# Patient Record
Sex: Male | Born: 1960 | Hispanic: Yes | Marital: Married | Smoking: Former smoker
Health system: Southern US, Community
[De-identification: ages and names within clinical notes are randomized; demographics above are authoritative.]

## PROBLEM LIST (undated history)

## (undated) DIAGNOSIS — E119 Type 2 diabetes mellitus without complications: Secondary | ICD-10-CM

## (undated) DIAGNOSIS — F32A Depression, unspecified: Secondary | ICD-10-CM

## (undated) DIAGNOSIS — F329 Major depressive disorder, single episode, unspecified: Secondary | ICD-10-CM

## (undated) HISTORY — DX: Depression, unspecified: F32.A

## (undated) HISTORY — PX: HERNIA REPAIR: SHX51

## (undated) HISTORY — DX: Type 2 diabetes mellitus without complications: E11.9

## (undated) HISTORY — DX: Major depressive disorder, single episode, unspecified: F32.9

---

## 2015-06-29 ENCOUNTER — Encounter (HOSPITAL_COMMUNITY): Payer: Self-pay | Admitting: Cardiology

## 2015-06-29 ENCOUNTER — Emergency Department (HOSPITAL_COMMUNITY)
Admission: EM | Admit: 2015-06-29 | Discharge: 2015-06-29 | Disposition: A | Discharge status: Home / Self Care | Payer: No Typology Code available for payment source | Attending: Emergency Medicine | Admitting: Emergency Medicine

## 2015-06-29 ENCOUNTER — Ambulatory Visit (INDEPENDENT_AMBULATORY_CARE_PROVIDER_SITE_OTHER): Payer: No Typology Code available for payment source | Admitting: Family Medicine

## 2015-06-29 VITALS — BP 106/72 | HR 72 | Temp 98.6°F | Resp 16 | Ht 67.5 in | Wt 142.0 lb

## 2015-06-29 DIAGNOSIS — R350 Frequency of micturition: Secondary | ICD-10-CM | POA: Diagnosis not present

## 2015-06-29 DIAGNOSIS — R634 Abnormal weight loss: Secondary | ICD-10-CM | POA: Diagnosis not present

## 2015-06-29 DIAGNOSIS — R002 Palpitations: Secondary | ICD-10-CM

## 2015-06-29 DIAGNOSIS — R739 Hyperglycemia, unspecified: Secondary | ICD-10-CM

## 2015-06-29 DIAGNOSIS — R358 Other polyuria: Secondary | ICD-10-CM | POA: Diagnosis present

## 2015-06-29 DIAGNOSIS — R252 Cramp and spasm: Secondary | ICD-10-CM

## 2015-06-29 DIAGNOSIS — E1165 Type 2 diabetes mellitus with hyperglycemia: Secondary | ICD-10-CM | POA: Diagnosis not present

## 2015-06-29 DIAGNOSIS — E119 Type 2 diabetes mellitus without complications: Secondary | ICD-10-CM

## 2015-06-29 LAB — POCT GLYCOSYLATED HEMOGLOBIN (HGB A1C): Hemoglobin A1C: >14

## 2015-06-29 LAB — POCT UA - MICROSCOPIC ONLY
BACTERIA, U MICROSCOPIC: NEGATIVE
CASTS, UR, LPF, POC: NEGATIVE
CRYSTALS, UR, HPF, POC: NEGATIVE
Epithelial cells, urine per micros: NEGATIVE
Mucus, UA: NEGATIVE
RBC, urine, microscopic: NEGATIVE
Yeast, UA: NEGATIVE

## 2015-06-29 LAB — BASIC METABOLIC PANEL
ANION GAP: 10 (ref 5–15)
BUN: 27 mg/dL — AB (ref 6–20)
CO2: 27 mmol/L (ref 22–32)
Calcium: 9.9 mg/dL (ref 8.9–10.3)
Chloride: 93 mmol/L — ABNORMAL LOW (ref 101–111)
Creatinine, Ser: 0.91 mg/dL (ref 0.61–1.24)
GFR calc Af Amer: >60 mL/min (ref >60)
GLUCOSE: 467 mg/dL — AB (ref 65–99)
Potassium: 4.5 mmol/L (ref 3.5–5.1)
SODIUM: 130 mmol/L — AB (ref 135–145)

## 2015-06-29 LAB — POCT CBC
Granulocyte percent: 69.4 %G (ref 37–80)
HCT, POC: 45.9 % (ref 43.5–53.7)
HEMOGLOBIN: 15.2 g/dL (ref 14.1–18.1)
Lymph, poc: 1.1 (ref 0.6–3.4)
MCH: 31.4 pg — AB (ref 27–31.2)
MCHC: 33.1 g/dL (ref 31.8–35.4)
MCV: 94.9 fL (ref 80–97)
MID (cbc): 0.3 (ref 0–0.9)
MPV: 8.1 fL (ref 0–99.8)
PLATELET COUNT, POC: 299 10*3/uL (ref 142–424)
POC Granulocyte: 3.1 (ref 2–6.9)
POC LYMPH %: 25 % (ref 10–50)
POC MID %: 5.6 % (ref 0–12)
RBC: 4.83 M/uL (ref 4.69–6.13)
RDW, POC: 12.7 %
WBC: 4.5 10*3/uL — AB (ref 4.6–10.2)

## 2015-06-29 LAB — URINALYSIS, ROUTINE W REFLEX MICROSCOPIC
Bilirubin Urine: NEGATIVE
Hgb urine dipstick: NEGATIVE
KETONES UR: 15 mg/dL — AB
Leukocytes, UA: NEGATIVE
Nitrite: NEGATIVE
PH: 6 (ref 5.0–8.0)
PROTEIN: NEGATIVE mg/dL
Specific Gravity, Urine: 1.038 — ABNORMAL HIGH (ref 1.005–1.030)
Urobilinogen, UA: 0.2 mg/dL (ref 0.0–1.0)

## 2015-06-29 LAB — POCT URINALYSIS DIPSTICK
BILIRUBIN UA: NEGATIVE
Glucose, UA: >1000
Ketones, UA: NEGATIVE
LEUKOCYTES UA: NEGATIVE
NITRITE UA: NEGATIVE
PROTEIN UA: NEGATIVE
RBC UA: NEGATIVE
Spec Grav, UA: <=1.005
UROBILINOGEN UA: 0.2
pH, UA: 6

## 2015-06-29 LAB — COMPREHENSIVE METABOLIC PANEL
ALBUMIN: 4.2 g/dL (ref 3.6–5.1)
ALT: 17 U/L (ref 9–46)
AST: 15 U/L (ref 10–35)
Alkaline Phosphatase: 122 U/L — ABNORMAL HIGH (ref 40–115)
BUN: 29 mg/dL — ABNORMAL HIGH (ref 7–25)
CO2: 22 mmol/L (ref 20–31)
CREATININE: 0.89 mg/dL (ref 0.70–1.33)
Calcium: 9.6 mg/dL (ref 8.6–10.3)
Chloride: 93 mmol/L — ABNORMAL LOW (ref 98–110)
Glucose, Bld: 541 mg/dL (ref 65–99)
POTASSIUM: 5.2 mmol/L (ref 3.5–5.3)
SODIUM: 127 mmol/L — AB (ref 135–146)
Total Bilirubin: 0.7 mg/dL (ref 0.2–1.2)
Total Protein: 8 g/dL (ref 6.1–8.1)

## 2015-06-29 LAB — CBC
HEMATOCRIT: 42.8 % (ref 39.0–52.0)
Hemoglobin: 15.2 g/dL (ref 13.0–17.0)
MCH: 33.2 pg (ref 26.0–34.0)
MCHC: 35.5 g/dL (ref 30.0–36.0)
MCV: 93.4 fL (ref 78.0–100.0)
Platelets: 299 10*3/uL (ref 150–400)
RBC: 4.58 MIL/uL (ref 4.22–5.81)
RDW: 12.3 % (ref 11.5–15.5)
WBC: 4.5 10*3/uL (ref 4.0–10.5)

## 2015-06-29 LAB — CBG MONITORING, ED
GLUCOSE-CAPILLARY: 425 mg/dL — AB (ref 65–99)
Glucose-Capillary: 330 mg/dL — ABNORMAL HIGH (ref 65–99)
Glucose-Capillary: 217 mg/dL — ABNORMAL HIGH (ref 65–99)

## 2015-06-29 LAB — I-STAT VENOUS BLOOD GAS, ED
ACID-BASE DEFICIT: 3 mmol/L — AB (ref 0.0–2.0)
Bicarbonate: 24.5 mEq/L — ABNORMAL HIGH (ref 20.0–24.0)
O2 SAT: 77 %
PCO2 VEN: 48.8 mmHg (ref 45.0–50.0)
PH VEN: 7.309 — AB (ref 7.250–7.300)
TCO2: 26 mmol/L (ref 0–100)
pO2, Ven: 46 mmHg — ABNORMAL HIGH (ref 30.0–45.0)

## 2015-06-29 LAB — URINE MICROSCOPIC-ADD ON

## 2015-06-29 LAB — TSH: TSH: 1.046 u[IU]/mL (ref 0.350–4.500)

## 2015-06-29 LAB — GLUCOSE, POCT (MANUAL RESULT ENTRY): POC Glucose: >444 mg/dl (ref 70–99)

## 2015-06-29 LAB — CK: Total CK: 255 U/L — ABNORMAL HIGH (ref 7–232)

## 2015-06-29 MED ORDER — METFORMIN HCL 500 MG PO TABS: 500.0000 mg | ORAL_TABLET | Freq: Two times a day (BID) | ORAL | Status: DC

## 2015-06-29 MED ORDER — SODIUM CHLORIDE 0.9 % IV BOLUS (SEPSIS)
1000.0000 mL | Freq: Once | INTRAVENOUS | Status: AC
  Administered 2015-06-29: 1000 mL via INTRAVENOUS

## 2015-06-29 MED ORDER — INSULIN ASPART 100 UNIT/ML ~~LOC~~ SOLN
8.0000 [IU] | Freq: Once | SUBCUTANEOUS | Status: AC
  Administered 2015-06-29: 8 [IU] via INTRAVENOUS
  Filled 2015-06-29: qty 1

## 2015-06-29 NOTE — ED Provider Notes (Signed)
CSN: [id number]     Arrival date & time 06/29/15  1329 History   First MD Initiated Contact with Patient 06/29/15 1535     Chief Complaint  Patient presents with  . Hyperglycemia     (Consider location/radiation/quality/duration/timing/severity/associated sxs/prior Treatment) Patient is a 54 y.o. male presenting with hyperglycemia. The history is provided by the patient and medical records.  Hyperglycemia Associated symptoms: increased thirst and polyuria    This is a 54 year old male with history of type 2 diabetes, presenting to the ED from clinic for hyperglycemia. Patient reports he has been off of his diabetes medications for approximately 5 months. He was previously taking metformin and glimepiride.  He states he stopped taking his medications mostly because of the GI side effects (diarrhea, etc).  Patient states over the past few weeks he has been experiencing polyuria and polydipsia.  He denies abdominal pain, nausea, vomiting, or diarrhea.  He denies chest pain or SOB.  States he has had some palpitations, denies this currently.  VSS on arrival.  Past Medical History  Diagnosis Date  . Diabetes mellitus without complication    Past Surgical History  Procedure Laterality Date  . Hernia repair     Family History  Problem Relation Age of Onset  . Diabetes Father   . Diabetes Sister    History  Substance Use Topics  . Smoking status: Former Games developer  . Smokeless tobacco: Not on file  . Alcohol Use: No    Review of Systems  Endocrine: Positive for polydipsia and polyuria.  All other systems reviewed and are negative.     Allergies  Review of patient's allergies indicates no known allergies.  Home Medications   Prior to Admission medications   Not on File   BP 112/83 mmHg  Pulse 74  Temp(Src) 98.1 F (36.7 C) (Oral)  Resp 18  Wt 142 lb (64.411 kg)  SpO2 99%   Physical Exam  Constitutional: He is oriented to person, place, and time. He appears well-developed  and well-nourished. No distress.  HENT:  Head: Normocephalic and atraumatic.  Mouth/Throat: Oropharynx is clear and moist.  Mildly dry mucous membranes  Eyes: Conjunctivae and EOM are normal. Pupils are equal, round, and reactive to light.  Neck: Normal range of motion. Neck supple.  Cardiovascular: Normal rate, regular rhythm and normal heart sounds.   Pulmonary/Chest: Effort normal and breath sounds normal. No respiratory distress. He has no wheezes.  Abdominal: Soft. Bowel sounds are normal. There is no tenderness. There is no guarding.  Musculoskeletal: Normal range of motion. He exhibits no edema.  Neurological: He is alert and oriented to person, place, and time.  Skin: Skin is warm and dry. He is not diaphoretic.  Psychiatric: He has a normal mood and affect.  Nursing note and vitals reviewed.   ED Course  Procedures (including critical care time) Labs Review Labs Reviewed  BASIC METABOLIC PANEL - Abnormal; Notable for the following:    Sodium 130 (*)    Chloride 93 (*)    Glucose, Bld 467 (*)    BUN 27 (*)    All other components within normal limits  URINALYSIS, ROUTINE W REFLEX MICROSCOPIC (NOT AT [facility]) - Abnormal; Notable for the following:    Specific Gravity, Urine 1.038 (*)    Glucose, UA >1000 (*)    Ketones, ur 15 (*)    All other components within normal limits  CBG MONITORING, ED - Abnormal; Notable for the following:    Glucose-Capillary 425 (*)  All other components within normal limits  I-STAT VENOUS BLOOD GAS, ED - Abnormal; Notable for the following:    pH, Ven 7.309 (*)    pO2, Ven 46.0 (*)    Bicarbonate 24.5 (*)    Acid-base deficit 3.0 (*)    All other components within normal limits  CBG MONITORING, ED - Abnormal; Notable for the following:    Glucose-Capillary 330 (*)    All other components within normal limits  CBG MONITORING, ED - Abnormal; Notable for the following:    Glucose-Capillary 217 (*)    All other components within normal  limits  CBC  URINE MICROSCOPIC-ADD ON    Imaging Review No results found.   EKG Interpretation None      MDM   Final diagnoses:  Hyperglycemia   54 year old male sent here from outpatient clinic for hyperglycemia. CBG on arrival for 25. Patient has been off of his diabetic medications for the past 5 months. He is afebrile, nontoxic. He does complain of polyuria and polydipsia.  He denies any abdominal pain, nausea, or vomiting. Has previously had palpitations, none currently. No chest pain or shortness of breath. Labwork as above, mild electrolyte abnormalities, however anion gap and bicarbonate remained within normal limits.  U/a with small ketones.  VBG reassuring.  Clinical picture not consistent with DKA.  Patient was fluid resuscitated here with 2 L saline and given dose of IV insulin with improvement of glucose to 217.  Patient will be restarted on his metformin. He may need additional agents added in the future.  Patient given resource guide so he may find PCP, has [employer] so cannot be seen at wellness clinic.  Discussed plan with patient, he/she acknowledged understanding and agreed with plan of care.  Return precautions given for new or worsening symptoms.  Garlon Hatchet, PA-C 06/29/15 2159  Alvira Monday, MD 06/30/15 [phone]

## 2015-06-29 NOTE — Progress Notes (Addendum)
Subjective:    Patient ID: Terrence Myers, male    DOB: 31-Aug-1961, 54 y.o.   MRN: [id number] This chart was scribed for Terrence Staggers, MD by Terrence Myers, Medical Scribe. This patient was seen in Room 5 and the patient's care was started a 11:41 AM.  Chief Complaint  Patient presents with  . Losing weight    Onset 2 years    HPI HPI Comments: Terrence Myers is a 54 y.o. male with a past hx of DM who presents to [facility] complaining of weight loss for the last two months, he has lost 10 pounds. Pt has not taken his DM medication in the last four months since he moved [place] from [place]. Pt is also complaining of cramps in his arms and legs. Pt states his appetite has been good. His last A1C was 6.1, per pt. When the pt was treated for his DM he took two medications, Glucophage and one other that the pt does not remember. Pt endorses urinary frequency and increase thirst, worse at night. He denies HA, nausea, vomiting, abdominal pain, or blurry vision. He has no past hx of lung, liver, kidney issues, denies dark or tarry stools or other bowel problems. Pt endorses occasional palpitations when he is at work, working outside in the heat. Pt does not smoke. Checked his own blood sugar few days ago, was 345.   Pt works outside Programmer, applications for [employer].   There are no active problems to display for this patient.  Past Medical History  Diagnosis Date  . Diabetes mellitus without complication    Past Surgical History  Procedure Laterality Date  . Hernia repair     No Known Allergies Prior to Admission medications   Not on File   History   Social History  . Marital Status: Married    Spouse Name: N/A  . Number of Children: N/A  . Years of Education: N/A   Occupational History  . Not on file.   Social History Main Topics  . Smoking status: Former Games developer  . Smokeless tobacco: Not on file  . Alcohol Use: No  . Drug Use: No  . Sexual Activity: Not on file   Other Topics  Concern  . Not on file   Social History Narrative  . No narrative on file    Review of Systems  Constitutional: Positive for unexpected weight change. Negative for fever, chills, activity change and fatigue.  Respiratory: Negative for chest tightness and shortness of breath.   Cardiovascular: Positive for palpitations. Negative for chest pain and leg swelling.  Genitourinary: Positive for urgency and frequency. Negative for dysuria and flank pain.  Musculoskeletal: Positive for myalgias.  Neurological: Negative for dizziness, light-headedness and headaches.       Objective:   Physical Exam  Constitutional: He is oriented to person, place, and time. He appears well-developed and well-nourished. No distress.  HENT:  Head: Normocephalic and atraumatic.  Eyes: Pupils are equal, round, and reactive to light.  Neck: Neck supple.  Cardiovascular: Normal rate.   Pulmonary/Chest: Effort normal. No respiratory distress.  Abdominal: Soft. There is no tenderness.  No hepatosplenomegaly.  Musculoskeletal: Normal range of motion.  Neurological: He is alert and oriented to person, place, and time. Coordination normal.  Skin: Skin is warm and dry. He is not diaphoretic.  Psychiatric: He has a normal mood and affect. His behavior is normal.  Nursing note and vitals reviewed.   Filed Vitals:   06/29/15 0953  BP:  106/72  Pulse: 72  Temp: 98.6 F (37 C)  TempSrc: Oral  Resp: 16  Height: 5' 7.5" (1.715 m)  Weight: 142 lb (64.411 kg)  SpO2: 98%   EKG: Sinus rhythm, early repolarization on anterior lateral leads, no acute findings. No prior EKG available for review.  Results for orders placed or performed in visit on 06/29/15  POCT CBC  Result Value Ref Range   WBC 4.5 (A) 4.6 - 10.2 K/uL   Lymph, poc 1.1 0.6 - 3.4   POC LYMPH PERCENT 25.0 10 - 50 %L   MID (cbc) 0.3 0 - 0.9   POC MID % 5.6 0 - 12 %M   POC Granulocyte 3.1 2 - 6.9   Granulocyte percent 69.4 37 - 80 %G   RBC 4.83  4.69 - 6.13 M/uL   Hemoglobin 15.2 14.1 - 18.1 g/dL   HCT, POC 40.9 81.1 - 53.7 %   MCV 94.9 80 - 97 fL   MCH, POC 31.4 (A) 27 - 31.2 pg   MCHC 33.1 31.8 - 35.4 g/dL   RDW, POC 91.4 %   Platelet Count, POC 299 142 - 424 K/uL   MPV 8.1 0 - 99.8 fL  POCT glucose (manual entry)  Result Value Ref Range   POC Glucose >444 70 - 99 mg/dl  POCT glycosylated hemoglobin (Hb A1C)  Result Value Ref Range   Hemoglobin A1C >14.0   POCT UA - Microscopic Only  Result Value Ref Range   WBC, Ur, HPF, POC meg    RBC, urine, microscopic neg    Bacteria, U Microscopic neg    Mucus, UA neg    Epithelial cells, urine per micros neg    Crystals, Ur, HPF, POC neg    Casts, Ur, LPF, POC neg    Yeast, UA neg   POCT urinalysis dipstick  Result Value Ref Range   Color, UA lt yellow    Clarity, UA clear    Glucose, UA >1000    Bilirubin, UA neg    Ketones, UA neg    Spec Grav, UA <=1.005    Blood, UA neg    pH, UA 6.0    Protein, UA neg    Urobilinogen, UA 0.2    Nitrite, UA neg    Leukocytes, UA Negative Negative      Assessment & Plan:   Terrence Myers is a 54 y.o. male Weight loss, abnormal - Plan: TSH, CK  Type 2 diabetes mellitus without complication - Plan: POCT glucose (manual entry), POCT glycosylated hemoglobin (Hb A1C), Comprehensive metabolic panel  Urinary frequency - Plan: POCT UA - Microscopic Only, POCT urinalysis dipstick  Palpitations - Plan: POCT CBC, TSH, EKG 12-Lead  Muscle cramps - Plan: Comprehensive metabolic panel, CK  History of diabetes managed with oral medications only in the past, but now nonadherence to meds for at least a couple of months. Recent weight loss, urinary frequency, but denies nausea/vomiting or abdominal pain. Suspect his symptoms are primarily due to hyperglycemia, but with muscle cramping did check CPK, TSH and CMP for the weight loss.   Appears nontoxic in the office, but glucose to high to read and hemoglobin A1c too high to read in office.  Will have evaluated through the emergency room to determine actual glucose reading, electrolytes, then determine if treatment with insulin in the ER then home or may need to be admitted depending on their workup. Triage nurse at [facility] ER advised.  If needed for primary  care, can return to our office with any provider accepting new patients for follow-up.   No orders of the defined types were placed in this encounter.   Patient Instructions  Because your sugars are too high to read here today, will need to be evaluated through [facility] emergency room and possibly treated there versus admitted to the hospital. I informed the triage nurse that you are on the way.  I did send off a thyroid tests kidney and liver tests and a muscle enzyme tests, but they may do some of this testing through the emergency room. If you need follow-up with her primary care provider in town, we can see you at our office, and are accepting new patients. Call [phone] and they can advise you of providers that are accepting new appointments and get you scheduled for an appointment.  Hyperglycemia Hyperglycemia occurs when the glucose (sugar) in your blood is too high. Hyperglycemia can happen for many reasons, but it most often happens to people who do not know they have diabetes or are not managing their diabetes properly.  CAUSES  Whether you have diabetes or not, there are other causes of hyperglycemia. Hyperglycemia can occur when you have diabetes, but it can also occur in other situations that you might not be as aware of, such as: Diabetes  If you have diabetes and are having problems controlling your blood glucose, hyperglycemia could occur because of some of the following reasons:  Not following your meal plan.  Not taking your diabetes medications or not taking it properly.  Exercising less or doing less activity than you normally do.  Being sick. Pre-diabetes  This cannot be ignored. Before people  develop Type 2 diabetes, they almost always have "pre-diabetes." This is when your blood glucose levels are higher than normal, but not yet high enough to be diagnosed as diabetes. Research has shown that some long-term damage to the body, especially the heart and circulatory system, may already be occurring during pre-diabetes. If you take action to manage your blood glucose when you have pre-diabetes, you may delay or prevent Type 2 diabetes from developing. Stress  If you have diabetes, you may be "diet" controlled or on oral medications or insulin to control your diabetes. However, you may find that your blood glucose is higher than usual in the hospital whether you have diabetes or not. This is often referred to as "stress hyperglycemia." Stress can elevate your blood glucose. This happens because of hormones put out by the body during times of stress. If stress has been the cause of your high blood glucose, it can be followed regularly by your caregiver. That way he/she can make sure your hyperglycemia does not continue to get worse or progress to diabetes. Steroids  Steroids are medications that act on the infection fighting system (immune system) to block inflammation or infection. One side effect can be a rise in blood glucose. Most people can produce enough extra insulin to allow for this rise, but for those who cannot, steroids make blood glucose levels go even higher. It is not unusual for steroid treatments to "uncover" diabetes that is developing. It is not always possible to determine if the hyperglycemia will go away after the steroids are stopped. A special blood test called an A1c is sometimes done to determine if your blood glucose was elevated before the steroids were started. SYMPTOMS  Thirsty.  Frequent urination.  Dry mouth.  Blurred vision.  Tired or fatigue.  Weakness.  Sleepy.  Tingling in feet or leg. DIAGNOSIS  Diagnosis is made by monitoring blood glucose in one  or all of the following ways:  A1c test. This is a chemical found in your blood.  Fingerstick blood glucose monitoring.  Laboratory results. TREATMENT  First, knowing the cause of the hyperglycemia is important before the hyperglycemia can be treated. Treatment may include, but is not be limited to:  Education.  Change or adjustment in medications.  Change or adjustment in meal plan.  Treatment for an illness, infection, etc.  More frequent blood glucose monitoring.  Change in exercise plan.  Decreasing or stopping steroids.  Lifestyle changes. HOME CARE INSTRUCTIONS   Test your blood glucose as directed.  Exercise regularly. Your caregiver will give you instructions about exercise. Pre-diabetes or diabetes which comes on with stress is helped by exercising.  Eat wholesome, balanced meals. Eat often and at regular, fixed times. Your caregiver or nutritionist will give you a meal plan to guide your sugar intake.  Being at an ideal weight is important. If needed, losing as little as 10 to 15 pounds may help improve blood glucose levels. SEEK MEDICAL CARE IF:   You have questions about medicine, activity, or diet.  You continue to have symptoms (problems such as increased thirst, urination, or weight gain). SEEK IMMEDIATE MEDICAL CARE IF:   You are vomiting or have diarrhea.  Your breath smells fruity.  You are breathing faster or slower.  You are very sleepy or incoherent.  You have numbness, tingling, or pain in your feet or hands.  You have chest pain.  Your symptoms get worse even though you have been following your caregiver's orders.  If you have any other questions or concerns. Document Released: 05/08/2001 Document Revised: 02/04/2012 Document Reviewed: 03/10/2012 [facility] Patient Information 2015 [place], [place]. This information is not intended to replace advice given to you by your health care provider. Make sure you discuss any questions you have with  your health care provider.        I personally performed the services described in this documentation, which was scribed in my presence. The recorded information has been reviewed and considered, and addended by me as needed.

## 2015-06-29 NOTE — ED Notes (Signed)
Pt reports that he came from he clinic for hyperglycemia. Reports he has had increased weight loss, increased urination and thirst. Pt is tyoe 2 diabetic and has not had his medications.

## 2015-06-29 NOTE — Patient Instructions (Signed)
Because your sugars are too high to read here today, will need to be evaluated through [facility] emergency room and possibly treated there versus admitted to the hospital. I informed the triage nurse that you are on the way.  I did send off a thyroid tests kidney and liver tests and a muscle enzyme tests, but they may do some of this testing through the emergency room. If you need follow-up with her primary care provider in town, we can see you at our office, and are accepting new patients. Call [phone] and they can advise you of providers that are accepting new appointments and get you scheduled for an appointment.  Hyperglycemia Hyperglycemia occurs when the glucose (sugar) in your blood is too high. Hyperglycemia can happen for many reasons, but it most often happens to people who do not know they have diabetes or are not managing their diabetes properly.  CAUSES  Whether you have diabetes or not, there are other causes of hyperglycemia. Hyperglycemia can occur when you have diabetes, but it can also occur in other situations that you might not be as aware of, such as: Diabetes  If you have diabetes and are having problems controlling your blood glucose, hyperglycemia could occur because of some of the following reasons:  Not following your meal plan.  Not taking your diabetes medications or not taking it properly.  Exercising less or doing less activity than you normally do.  Being sick. Pre-diabetes  This cannot be ignored. Before people develop Type 2 diabetes, they almost always have "pre-diabetes." This is when your blood glucose levels are higher than normal, but not yet high enough to be diagnosed as diabetes. Research has shown that some long-term damage to the body, especially the heart and circulatory system, may already be occurring during pre-diabetes. If you take action to manage your blood glucose when you have pre-diabetes, you may delay or prevent Type 2 diabetes from  developing. Stress  If you have diabetes, you may be "diet" controlled or on oral medications or insulin to control your diabetes. However, you may find that your blood glucose is higher than usual in the hospital whether you have diabetes or not. This is often referred to as "stress hyperglycemia." Stress can elevate your blood glucose. This happens because of hormones put out by the body during times of stress. If stress has been the cause of your high blood glucose, it can be followed regularly by your caregiver. That way he/she can make sure your hyperglycemia does not continue to get worse or progress to diabetes. Steroids  Steroids are medications that act on the infection fighting system (immune system) to block inflammation or infection. One side effect can be a rise in blood glucose. Most people can produce enough extra insulin to allow for this rise, but for those who cannot, steroids make blood glucose levels go even higher. It is not unusual for steroid treatments to "uncover" diabetes that is developing. It is not always possible to determine if the hyperglycemia will go away after the steroids are stopped. A special blood test called an A1c is sometimes done to determine if your blood glucose was elevated before the steroids were started. SYMPTOMS  Thirsty.  Frequent urination.  Dry mouth.  Blurred vision.  Tired or fatigue.  Weakness.  Sleepy.  Tingling in feet or leg. DIAGNOSIS  Diagnosis is made by monitoring blood glucose in one or all of the following ways:  A1c test. This is a chemical found in  your blood.  Fingerstick blood glucose monitoring.  Laboratory results. TREATMENT  First, knowing the cause of the hyperglycemia is important before the hyperglycemia can be treated. Treatment may include, but is not be limited to:  Education.  Change or adjustment in medications.  Change or adjustment in meal plan.  Treatment for an illness, infection, etc.  More  frequent blood glucose monitoring.  Change in exercise plan.  Decreasing or stopping steroids.  Lifestyle changes. HOME CARE INSTRUCTIONS   Test your blood glucose as directed.  Exercise regularly. Your caregiver will give you instructions about exercise. Pre-diabetes or diabetes which comes on with stress is helped by exercising.  Eat wholesome, balanced meals. Eat often and at regular, fixed times. Your caregiver or nutritionist will give you a meal plan to guide your sugar intake.  Being at an ideal weight is important. If needed, losing as little as 10 to 15 pounds may help improve blood glucose levels. SEEK MEDICAL CARE IF:   You have questions about medicine, activity, or diet.  You continue to have symptoms (problems such as increased thirst, urination, or weight gain). SEEK IMMEDIATE MEDICAL CARE IF:   You are vomiting or have diarrhea.  Your breath smells fruity.  You are breathing faster or slower.  You are very sleepy or incoherent.  You have numbness, tingling, or pain in your feet or hands.  You have chest pain.  Your symptoms get worse even though you have been following your caregiver's orders.  If you have any other questions or concerns. Document Released: 05/08/2001 Document Revised: 02/04/2012 Document Reviewed: 03/10/2012 [facility] Patient Information 2015 [place], [place]. This information is not intended to replace advice given to you by your health care provider. Make sure you discuss any questions you have with your health care provider.

## 2015-06-29 NOTE — Discharge Instructions (Signed)
Take the prescribed medication as directed. Follow-up with a primary care physician in the area.  See resource guide to help you with this. Return to the ED for new or worsening symptoms.   Emergency Department Resource Guide 1) Find a Doctor and Pay Out of Pocket Although you won't have to find out who is covered by your insurance plan, it is a good idea to ask around and get recommendations. You will then need to call the office and see if the doctor you have chosen will accept you as a new patient and what types of options they offer for patients who are self-pay. Some doctors offer discounts or will set up payment plans for their patients who do not have insurance, but you will need to ask so you aren't surprised when you get to your appointment.  2) Contact Your Local Health Department Not all health departments have doctors that can see patients for sick visits, but many do, so it is worth a call to see if yours does. If you don't know where your local health department is, you can check in your phone book. The CDC also has a tool to help you locate your state's health department, and many state websites also have listings of all of their local health departments.  3) Find a Walk-in Clinic If your illness is not likely to be very severe or complicated, you may want to try a walk in clinic. These are popping up all over the country in pharmacies, drugstores, and shopping centers. They're usually staffed by nurse practitioners or physician assistants that have been trained to treat common illnesses and complaints. They're usually fairly quick and inexpensive. However, if you have serious medical issues or chronic medical problems, these are probably not your best option.  No Primary Care Doctor: - Call Health Connect at  [phone] - they can help you locate a primary care doctor that  accepts your insurance, provides certain services, etc. - Physician Referral Service- [phone]  Chronic  Pain Problems: Organization         Address  Phone   Notes  [facility]  [phone] Patients need to be referred by their primary care doctor.   Medication Assistance: Organization         Address  Phone   Notes  [facility] [address] [place], [place] [address] [phone] --Must be a resident of [facility] -- Must have NO insurance coverage whatsoever (no Medicaid/ Medicare, etc.) -- The pt. MUST have a primary care doctor that directs their care regularly and follows them in the community   [facility]  [phone]   [employer]  [phone]    Agencies that provide inexpensive medical care: Organization         Address  Phone   Notes  [facility]  [phone]   [facility]    [phone]   [facility] [address] [place], [place] [address] [phone]   [facility] [address] [place], [place] [phone]   [facility]anned [facility]    [phone]   [facility]    [phone]   [facility]  [address], [place] Phone:  [phone], Fax:  [phone] Hours of Operation:  9 am - 6 pm, M-F.  Also accepts Medicaid/Medicare and self-pay.  [facility]  [address], [place] Phone: [phone], Fax: [phone]. Hours of Operation:  8:30 am - 5:30 pm, M-F.  Also accepts Medicaid and self-pay.  °[facility] [place] [address], [place] Phone: [phone]   °[facility] [address], [place], [place] [phone], Ext. 123 Mondays & Thursdays: 7-9 AM.  First 15 patients are seen on a first come, first serve basis. °  ° °Medicaid-accepting [place] Providers: ° °Organization         Address  Phone   Notes  °[facility] [address], [place] [phone] Also  accepts self-pay patients.  °[facility] [address], [place] ° ([phone]   °[facility] [address], [place] [phone]) [phone]   °[facility] [address] [place] [address], [place] [phone]) [phone]   °Veita Bland [address], [place]  ° [phone] Only accepts [place] Access Medicaid patients after they have their name applied to their card.  ° °Self-Pay (no insurance) in [place]: ° °Organization         Address  Phone   Notes  °Sickle Cell Patients, [facility] [address], [place] [phone]) [phone]   °[facility] [address], [place] [phone]) [phone]   °[facility] ° [address], [place] [phone]) [phone]   °[facility]/Dr. Osei-Bonsu ° [address] [place] [address], [place] or [address], [place] [phone] Phone number for both [place] and [place] locations is the same.  °[facility] [address], [place] ([phone]) [phone]   °[facility] [address] [place] [address], [place] or [address] [phone]) [phone] °([phone]) [phone]   °[facility] [address], [place] ([phone]) [phone], phone; ([phone], fax Sees patients 1st and 3rd Saturday of every month.  Must not qualify for public or private insurance (i.e. Medicaid, Medicare, [place] Health Choice, Veterans' Benefits) • Household income should be no more than 200% of the poverty level •The clinic cannot treat you if you are pregnant or think you are pregnant • Sexually transmitted diseases are not treated at the clinic.  ° ° °Dental Care: °Organization         Address  Phone  Notes  °[place] [facility] [address], [place] [phone]) [phone] Accepts children up to age 21 who are enrolled in Medicaid or [place] Health Choice; pregnant  women with a Medicaid card; and children who have applied for Medicaid or [place] Health Choice, but were declined, whose parents can pay a reduced fee at time of service.  °[place] [facility] [place]  [address], [place] [phone]) [phone] Accepts children up to age 21 who are enrolled in Medicaid or [place] Health Choice; pregnant women with a Medicaid card; and children who have applied for Medicaid or  Health Choice, but were declined, whose parents can pay a reduced fee at time of service.  °[facility] ° [address],  ([phone]) [phone] Patients are seen by appointment only. Walk-ins are not accepted. [facility] will see patients 18 years of age and older. °Monday - Tuesday (8am-5pm) °Most Wednesdays (8:30-5pm) °$30 per visit, cash only  °[facility]  [address], [facility] [phone] Patients are seen by appointment only. Walk-ins are not accepted. [place] will see patients 60 years of age and older. One Wednesday Evening (Monthly: Volunteer Based).  $30 per visit, cash only  [place]  [phone] for adults; Children under age 4, call Graduate Pediatric Dentistry at [phone]. Children aged 49-14, please call [phone] to request a pediatric application.  Dental services are provided in all areas of dental care including fillings, crowns and bridges, complete and partial dentures, implants, gum treatment, root canals, and extractions. Preventive care is also provided. Treatment is provided to both adults and children. Patients are selected via a lottery and there is often a waiting list.   [facility] [address] [place]  [phone] [email or web address]   [facility] [address] [place], [place] [phone], Ext. [phone] Second and Fourth Thursday of each month, opens at 6:30 AM; Clinic ends at 9 AM.  Patients are  seen on a first-come first-served basis, and a limited number are seen during each clinic.   [facility]  [address] Hillard Danker [place], [place] [phone]   Eligibility Requirements You must have lived in [place], [place], or [place] counties for at least the last three months.   You cannot be eligible for state or federal sponsored [employer], including [employer], [place], or [employer].   You generally cannot be eligible for healthcare insurance through your employer.    How to apply: Eligibility screenings are held every Tuesday and Wednesday afternoon from 1:00 pm until 4:00 pm. You do not need an appointment for the interview!  [facility] [address] [place], [place]   [place] Department  [place]  [phone]    Behavioral Health Resources in the Community: Intensive Outpatient Programs Organization         Address  Phone  Notes  [facility]. [address], [place], [place] [phone]   [facility] [address], [place], [place]   [facility] [address] [place] [address], [place], [place]   [place] [address],  [place], [place] or [phone]   Substance Abuse Resources Organization         Address  Phone  Notes  [facility]  [phone]   [place]  [phone]   [facility] [place]   Chinita Pester  [phone]   Residential & Outpatient Substance Abuse Program  [phone]   Psychological Services Organization         Address  Phone  Notes  [facility] [place]  [place]  [phone]   [place] [address] [place] [phone] or [phone]    Mobile Crisis  Teams Organization         Address  Phone  Notes  [facility], Mobile Crisis Care Unit  [phone]   [facility]  [address] [place], [place]   [facility] [address] [place] [phone]    Self-Help/Support Groups Organization         Address  Phone             Notes  [facility] [place] - variety of support groups  [place] Call for more information  Narcotics Anonymous (NA), [facility] [address], [employer] [place]  2 meetings at this location   Special educational needs teacher         Address  Phone  Notes  [facility] [place],    [place]  [phone]   [facility]  [address], [place] [address], [place], [place]   [place], [place] [phone] Admissions: 8am-3pm M-F  Incentives Substance [place] [address],    [place], [place] [phone]   [facility] [address] [place], [place], [place]   [facility] [address]  [place], [place]   [facility] - Intensive Outpatient [address], [place], [place]   [facility] [place]  [place], [place] [phone] or [phone]   [facility] ([facility]) [address] [place], [place] Accepts Medicaid  [facility] [address],  [place] [phone] Substance Abuse/Addiction Treatment   [facility] Organization         Address  Phone  Notes  [facility]  [phone]   Domenic Schwab, PhD [address] Arlis Porta [place], [place]   [phone] or [phone]   [facility] [place], [place] [phone]   [facility] [address], [place], [place] [phone]  Insurance/Medicaid/sponsorship through [facility] [address] [place] [address]                                    [place], [place] [phone] [place] [address], [place] [phone]    Dr. Adele Schilder  [phone]   [facility] Dept. 1) [address] [place] 2) [place] 3)  [place] [address], [place] [phone]  [phone]   [place] [phone] or [phone] (After Hours)

## 2015-07-09 ENCOUNTER — Ambulatory Visit (INDEPENDENT_AMBULATORY_CARE_PROVIDER_SITE_OTHER): Payer: No Typology Code available for payment source | Admitting: Family Medicine

## 2015-07-09 VITALS — BP 108/70 | HR 59 | Temp 97.4°F | Ht 68.0 in | Wt 149.6 lb

## 2015-07-09 DIAGNOSIS — E1165 Type 2 diabetes mellitus with hyperglycemia: Secondary | ICD-10-CM

## 2015-07-09 DIAGNOSIS — R531 Weakness: Secondary | ICD-10-CM

## 2015-07-09 DIAGNOSIS — R51 Headache: Secondary | ICD-10-CM | POA: Diagnosis not present

## 2015-07-09 DIAGNOSIS — IMO0002 Reserved for concepts with insufficient information to code with codable children: Secondary | ICD-10-CM

## 2015-07-09 DIAGNOSIS — R519 Headache, unspecified: Secondary | ICD-10-CM

## 2015-07-09 LAB — BASIC METABOLIC PANEL
BUN: 18 mg/dL (ref 7–25)
CALCIUM: 8.8 mg/dL (ref 8.6–10.3)
CHLORIDE: 102 mmol/L (ref 98–110)
CO2: 22 mmol/L (ref 20–31)
Creat: 0.63 mg/dL — ABNORMAL LOW (ref 0.70–1.33)
Glucose, Bld: 240 mg/dL — ABNORMAL HIGH (ref 65–99)
Potassium: 4.8 mmol/L (ref 3.5–5.3)
Sodium: 133 mmol/L — ABNORMAL LOW (ref 135–146)

## 2015-07-09 LAB — GLUCOSE, POCT (MANUAL RESULT ENTRY): POC Glucose: 244 mg/dl — AB (ref 70–99)

## 2015-07-09 NOTE — Patient Instructions (Signed)
For your diabetes, we can increase the metformin to 2 pills twice per day, as that was the dose you took previously.  Continue to monitor your blood sugar, bring a record of these readings in the next 2-3 weeks and we can determine if an additional medication is needed, or if referral to endocrinology is needed to help with management of the diabetes. If you feel like your headaches are worsening at the higher dose of metformin, return to discuss different medication.  I also referred you to diabetes classes or nutritionist on what coverage her insurance may pay for. Check into the cost of those visits when they call you to schedule.   For your headaches, I will order a CT scan of your head  given your episodic weakness, and refer you to a neurologist. If you have return of weakness, especially any focal weakness with one arm one leg or 1 poor your body, or slurred speech, go immediately to the emergency room or call 911.  As long as the CT scan does not show any bleeding, I would recommend an aspirin 81 mg once per day to lessen risk of stroke.  Return to the clinic or go to the nearest emergency room if any of your symptoms worsen or new symptoms occur.

## 2015-07-09 NOTE — Progress Notes (Signed)
Subjective:  This chart was scribed for Terrence Post, MD by Broadus John, Medical Scribe. This patient was seen in Room 2 and the patient's care was started at 10:10 AM.   Patient ID: Terrence Myers, male    DOB: 12-28-1960, 54 y.o.   MRN: [id number]  Chief Complaint  Patient presents with  . Follow-up    c/o Metforming causing HA  . Headache   HPI HPI Comments: Terrence Myers is a 54 y.o. male who [facility] for a follow up of uncontrolled diabetes. Initial visit here with me was 10 days ago. At that time he was having weight loss, increased thirst, blood sugar was too high to read at office- registered as 541 on the BMP, and hemoglobin A1C was also too high to read. He had been on two diabetes med in the past, pt had not taken those in the past 4 months after moving from [place], [place]. He was treated in the ER given IV fluids, Glucose was 467, 8 units of novolog and IV insulin to bring the levels down to 217. He was discharge on metformin 500 mg BID, and advises to follow up with PCP.  Today, pt reports that he has been compliant with his metformin BID, however he notes that he has been having headaches on the side of his head mostly in the morning time almost every day since starting taking the medication about 4 years ago. In addition pt states having associated symptoms of occasional nausea, and weakness all over onset 2 days ago that lasted for about 2 hours. Pt reports that he had an episode of focal weakness few years ago where he was evaluated at the hospital for. Pt reports that when he stopped taking the metformin for a short period of time, he still experienced headaches, however less frequent. Pt also indicates that when he used to be on 100 mg of metformin he experiences headaches with the same intensity as now when he is on 500 mg. Pt reports that he has not seen a neurologist for his symptoms, and denies having a previous history of CVA.  Pt  denies abdominal pain, vomiting, focal weakness, slurred speech. Pt notes that he has been checking his blood sugar regularly. He notes that the lowest his CBG has bee was 291, and the highest was 345. Pt states that he eats 3 meals a day regularly.   There are no active problems to display for this patient.  Past Medical History  Diagnosis Date  . Diabetes mellitus without complication    Past Surgical History  Procedure Laterality Date  . Hernia repair     No Known Allergies Prior to Admission medications   Medication Sig Start Date End Date Taking? Authorizing Provider  metFORMIN (GLUCOPHAGE) 500 MG tablet Take 1 tablet (500 mg total) by mouth 2 (two) times daily with a meal. 06/29/15  Yes Terrence Hatchet, PA-C   Social History   Social History  . Marital Status: Married    Spouse Name: N/A  . Number of Children: N/A  . Years of Education: N/A   Occupational History  . Not on file.   Social History Main Topics  . Smoking status: Former Games developer  . Smokeless tobacco: Not on file  . Alcohol Use: No  . Drug Use: No  . Sexual Activity: Not on file   Other Topics Concern  . Not on file   Social History Narrative  Review of Systems  Gastrointestinal: Positive for nausea. Negative for vomiting and abdominal pain.  Neurological: Positive for weakness and headaches. Negative for speech difficulty.      Objective:   Physical Exam  Constitutional: He is oriented to person, place, and time. He appears well-developed and well-nourished. No distress.  HENT:  Head: Normocephalic and atraumatic.  Eyes: EOM are normal. Pupils are equal, round, and reactive to light. Right eye exhibits no nystagmus. Left eye exhibits no nystagmus.  Neck: Neck supple.  Cardiovascular: Normal rate.   Pulmonary/Chest: Effort normal.  Abdominal: Soft. He exhibits no distension. There is no tenderness.  Neurological: He is alert and oriented to person, place, and time. No cranial nerve deficit.    Negative Romberg.  No pronator drift Equal grip strength.  Normal head to toe.  Non focal neuro exam.  In the lower extremities: Equal grip strength, equal strength, no weakness  Skin: Skin is warm and dry.  Psychiatric: He has a normal mood and affect. His behavior is normal.  Nursing note and vitals reviewed.  Filed Vitals:   07/09/15 0855  BP: 108/70  Pulse: 59  Temp: 97.4 F (36.3 C)  TempSrc: Oral  Height: 5\' 8"  (1.727 m)  Weight: 149 lb 9.6 oz (67.858 kg)  SpO2: 98%    Results for orders placed or performed in visit on 07/09/15  POCT glucose (manual entry)  Result Value Ref Range   POC Glucose 244 (A) 70 - 99 mg/dl      Assessment & Plan:  Terrence Myers is a 54 y.o. male Diabetes type 2, uncontrolled - Plan: POCT glucose (manual entry), Basic metabolic panel, Ambulatory referral to diabetic education  -Uncontrolled prior with medication nonadherence. No back on metformin, but was prior at 1000 mg twice a day. Still uncontrolled in office, increase metformin back to 1000 mg twice a day, then recheck in the next 2-3 weeks with home readings. May need second agent.  -Refer to diabetes education for classes for or nutritionist evaluation.  Generalized weakness - Plan: CT Head Wo Contrast, Ambulatory referral to Neurology  -Suspect component of this due to uncontrolled diabetes. Along with headache as below, will refer to neurology for evaluation. He has not had any focal weakness on exam today so no urgent imaging was ordered. RTC/ER precautions discussed  Chronic nonintractable headache, unspecified headache type - Plan: CT Head Wo Contrast, Ambulatory referral to Neurology  -Nonfocal neuro exam, but with episodic weakness will check CT of of his head..  Advised if he has any focal weakness, or weakness in one part of his body, RTC/ER/911 immediately (Instructions below on after visit summary should indicate one arm/one leg or one part of his body).   No orders of the  defined types were placed in this encounter.   Patient Instructions  For your diabetes, we can increase the metformin to 2 pills twice per day, as that was the dose you took previously.  Continue to monitor your blood sugar, bring a record of these readings in the next 2-3 weeks and we can determine if an additional medication is needed, or if referral to endocrinology is needed to help with management of the diabetes. If you feel like your headaches are worsening at the higher dose of metformin, return to discuss different medication.  I also referred you to diabetes classes or nutritionist on what coverage her insurance may pay for. Check into the cost of those visits when they call you to schedule.  For your headaches, I will order a CT scan of your head  given your episodic weakness, and refer you to a neurologist. If you have return of weakness, especially any focal weakness with one arm one leg or 1 poor your body, or slurred speech, go immediately to the emergency room or call 911.  As long as the CT scan does not show any bleeding, I would recommend an aspirin 81 mg once per day to lessen risk of stroke.  Return to the clinic or go to the nearest emergency room if any of your symptoms worsen or new symptoms occur.     I personally performed the services described in this documentation, which was scribed in my presence. The recorded information has been reviewed and considered, and addended by me as needed.

## 2015-07-13 ENCOUNTER — Ambulatory Visit (INDEPENDENT_AMBULATORY_CARE_PROVIDER_SITE_OTHER): Payer: No Typology Code available for payment source | Admitting: Emergency Medicine

## 2015-07-13 VITALS — BP 104/70 | HR 76 | Temp 98.4°F | Resp 18 | Ht 67.0 in | Wt 145.4 lb

## 2015-07-13 DIAGNOSIS — S29011A Strain of muscle and tendon of front wall of thorax, initial encounter: Secondary | ICD-10-CM

## 2015-07-13 DIAGNOSIS — S2341XA Sprain of ribs, initial encounter: Secondary | ICD-10-CM

## 2015-07-13 NOTE — Progress Notes (Signed)
Subjective:    Patient ID: Terrence Myers, male    DOB: [birth year], 54 y.o.   MRN: [id number]  HPI    Review of Systems     Objective:   Physical Exam        Assessment & Plan:   [facility] [address] [place] [address] [phone]  Date:  07/13/2015   Name:  Terrence Myers   DOB:  [birth year]   MRN:  [id number]  PCP:  No primary care provider on file.    Chief Complaint: Chest Pain; Shortness of Breath; and Blood Sugar Problem   History of Present Illness:  Terrence Myers is a 54 y.o. 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   Past Medical History  Diagnosis Date  . Diabetes mellitus without complication     Past Surgical History  Procedure Laterality Date  . Hernia repair      Social History  Substance Use Topics  . Smoking status: Former Games developer  . Smokeless tobacco: None  . Alcohol Use: No    Family History  Problem Relation Age of Onset  . Diabetes Father   . Diabetes Sister     No Known Allergies  Medication list has been reviewed and updated.  Current Outpatient Prescriptions on File Prior to Visit  Medication Sig Dispense Refill  .  metFORMIN (GLUCOPHAGE) 500 MG tablet Take 1 tablet (500 mg total) by mouth 2 (two) times daily with a meal. 60 tablet 1   No current facility-administered medications on file prior to visit.    Review of Systems:  All other pertinent ROS negative except as seen in HPI  Physical Examination: Filed Vitals:   07/13/15 1017  BP: 64/62  Pulse: 76  Temp: 98.4 F (36.9 C)  Resp: 18   Filed Vitals:   07/13/15 1017  Height: 5\' 7"  (1.702 m)  Weight: 145 lb 6.4 oz (65.953 kg)   Body mass index is 22.77 kg/(m^2). Ideal Body Weight: Weight in (lb) to have BMI = 25: 159.3  Recheck BP 104/70  GEN: WDWN, NAD, Non-toxic, A & O x 3 HEENT: Atraumatic, Normocephalic.  Ears and Nose: No external deformity. CV: RRR, No M/G/R. No JVD. No thrill. No extra heart sounds. PULM: CTAB, no wheezes, crackles, rhonchi. No retractions. No resp. distress. No accessory muscle use. ABD: S, NT, ND, +BS. No rebound. No HSM. No tenderness elicited, -murphy's, -rovsings BACK: mild tenderness to palpation of the intercostal musculature on right side in midthoracic region EXTR: No c/c/e NEURO Normal gait.  PSYCH: Normally interactive. Conversant. Not depressed or anxious appearing.  Calm demeanor.    Assessment and Plan: Intercostal muscle strain, initial encounter  Pt with mild TTP of intercostal musculature on the right side on exam  With labor intensive job will remove from work until Monday 8/22 Can use rest ice, and OTC NSAID for antiinflammatory/analgesia x 1 week, stop after that RTC if significantly worsening or no improvement    Signed Ben Adams-Doolittle, DO

## 2015-07-13 NOTE — Patient Instructions (Signed)
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

## 2015-07-15 ENCOUNTER — Encounter: Payer: Self-pay | Admitting: *Deleted

## 2015-07-15 ENCOUNTER — Encounter: Payer: No Typology Code available for payment source | Attending: Family Medicine | Admitting: *Deleted

## 2015-07-15 VITALS — Ht 68.0 in | Wt 156.2 lb

## 2015-07-15 DIAGNOSIS — E1165 Type 2 diabetes mellitus with hyperglycemia: Secondary | ICD-10-CM

## 2015-07-15 DIAGNOSIS — Z713 Dietary counseling and surveillance: Secondary | ICD-10-CM | POA: Diagnosis not present

## 2015-07-15 DIAGNOSIS — E118 Type 2 diabetes mellitus with unspecified complications: Secondary | ICD-10-CM | POA: Diagnosis not present

## 2015-07-15 DIAGNOSIS — IMO0002 Reserved for concepts with insufficient information to code with codable children: Secondary | ICD-10-CM

## 2015-07-15 NOTE — Patient Instructions (Signed)
Plan:  Aim for 4 Carb Choices per meal (60 grams) +/- 1 either way  Aim for 0-2 Carbs per snack if hungry  Include protein in moderation with your meals and snacks Consider reading food labels for Total Carbohydrate of foods Continue with your activity level daily as tolerated Consider checking BG at alternate times per day   Continue taking Diabetes medication Metformin as directed by MD

## 2015-07-17 ENCOUNTER — Encounter: Payer: Self-pay | Admitting: Family Medicine

## 2015-07-18 ENCOUNTER — Inpatient Hospital Stay: Admission: RE | Admit: 2015-07-18 | Payer: No Typology Code available for payment source | Source: Ambulatory Visit

## 2015-07-18 ENCOUNTER — Ambulatory Visit
Admission: RE | Admit: 2015-07-18 | Discharge: 2015-07-18 | Disposition: A | Discharge status: Home / Self Care | Payer: No Typology Code available for payment source | Source: Ambulatory Visit | Attending: Family Medicine | Admitting: Family Medicine

## 2015-07-18 DIAGNOSIS — R51 Headache: Secondary | ICD-10-CM

## 2015-07-18 DIAGNOSIS — R531 Weakness: Secondary | ICD-10-CM

## 2015-07-18 DIAGNOSIS — G8929 Other chronic pain: Secondary | ICD-10-CM

## 2015-07-21 ENCOUNTER — Encounter: Payer: Self-pay | Admitting: Neurology

## 2015-07-21 ENCOUNTER — Ambulatory Visit (INDEPENDENT_AMBULATORY_CARE_PROVIDER_SITE_OTHER): Payer: No Typology Code available for payment source | Admitting: Neurology

## 2015-07-21 VITALS — BP 110/62 | HR 68 | Resp 14 | Ht 67.0 in | Wt 155.0 lb

## 2015-07-21 DIAGNOSIS — R531 Weakness: Secondary | ICD-10-CM | POA: Diagnosis not present

## 2015-07-21 DIAGNOSIS — IMO0002 Reserved for concepts with insufficient information to code with codable children: Secondary | ICD-10-CM

## 2015-07-21 DIAGNOSIS — R519 Headache, unspecified: Secondary | ICD-10-CM

## 2015-07-21 DIAGNOSIS — R51 Headache: Secondary | ICD-10-CM | POA: Diagnosis not present

## 2015-07-21 DIAGNOSIS — E1165 Type 2 diabetes mellitus with hyperglycemia: Secondary | ICD-10-CM

## 2015-07-21 NOTE — Progress Notes (Signed)
Subjective:    Patient ID: Terrence Myers is a 54 y.o. male.  HPI     Huston Foley, MD, PhD [facility] [address] [address] [place], [place] [address]  Dear Dr. Neva Seat,   I saw your patient, Terrence Myers, upon your kind request in my neurologic clinic today for initial consultation of his headaches and episodic generalized weakness. The patient is accompanied by his wife and little GD today. As you know, Terrence Myers is a very pleasant 54 year old right-handed gentleman with an underlying medical history of suboptimally controlled diabetes, who reports recurrent headaches. He also reports intermittent weakness. I reviewed your office note from 07/09/2015. He was advised to start taking a baby aspirin. His metformin was increased. You ordered a head CT without contrast. He had this on 07/18/2015. This was reported as normal exam. I personally reviewed the images through the PACS system and agree with the findings. I also shared images on the computer for him and his wife to see. He had a hemoglobin A1c tested about 3 weeks ago when it was elevated at about 14. He had elevated blood sugar values up until recently. He reports that his headaches started when he was placed on metformin. He then stopped taking the medication and his headaches improved. Headaches are generalized and achy and dull. He often takes medication over-the-counter for the headache. When he was restarted on metformin his headaches became worse. He has been having episodes of generalized weakness for the past several months, about 4-5 months. Episodes last about 1-1-1/2 hours and are made worse by physical activity. He denies any one-sided weakness, numbness, tingling, droopy face or slurring of speech. He denies any one-sided headaches. He denies any double vision but has had some blurry vision. Of note, he has not had an eye exam in a few years. He tries to drink enough water. He is currently  not working. He used to work in [employer]. He drinks caffeine in the form of coffee, one cup per day. He does not drink alcohol. He does not smoke. He has never been a heavy drinker. He has no prior history of migraine headaches. Of note, he sleeps fairly well at this time. Overall, he feels that his episodes of weakness have improved since he has not been working. He does not snore significantly and his wife reports no apneic pauses while he is asleep. Currently, he reports a very mild headache, and no significant weakness.  His Past Medical History Is Significant For: Past Medical History  Diagnosis Date  . Diabetes mellitus without complication   . Depression     His Past Surgical History Is Significant For: Past Surgical History  Procedure Laterality Date  . Hernia repair      His Family History Is Significant For: Family History  Problem Relation Age of Onset  . Diabetes Father   . Diabetes Sister     His Social History Is Significant For: Social History   Social History  . Marital Status: Married    Spouse Name: N/A  . Number of Children: N/A  . Years of Education: Elementary   Occupational History  . N/A    Social History Main Topics  . Smoking status: Former Games developer  . Smokeless tobacco: None  . Alcohol Use: No  . Drug Use: No  . Sexual Activity: Not Asked   Other Topics Concern  . None   Social History Narrative    His Allergies Are:  No  Known Allergies:   His Current Medications Are:  Outpatient Encounter Prescriptions as of 07/21/2015  Medication Sig  . acetaminophen (TYLENOL) 325 MG tablet Take 650 mg by mouth every 6 (six) hours as needed.  . metFORMIN (GLUCOPHAGE) 500 MG tablet Take 1 tablet (500 mg total) by mouth 2 (two) times daily with a meal.   No facility-administered encounter medications on file as of 07/21/2015.  :  Review of Systems:  Out of a complete 14 point review of systems, all are reviewed and negative with the exception of  these symptoms as listed below:    Review of Systems  Eyes:       Blurred vision   Musculoskeletal:       Cramps  Neurological: Positive for headaches.       All over weakness after working long hours in the sun.   Reports continuous headache, increased with general weakness. This started after stopping metformin, but he is back on this medication now.    Psychiatric/Behavioral:       Depression     Objective:  Neurologic Exam  Physical Exam Physical Examination:   Filed Vitals:   07/21/15 1418  BP: 110/62  Pulse: 68  Resp: 14   General Examination: The patient is a very pleasant 54 y.o. male in no acute distress. He appears well-developed and well-nourished and well groomed.   HEENT: Normocephalic, atraumatic, pupils are equal, round and reactive to light and accommodation. Funduscopic exam is normal with sharp disc margins noted. Extraocular tracking is good without limitation to gaze excursion or nystagmus noted. Normal smooth pursuit is noted. Hearing is grossly intact. Face is symmetric with normal facial animation and normal facial sensation. Speech is clear with no dysarthria noted. There is no hypophonia. There is no lip, neck/head, jaw or voice tremor. Neck is supple with full range of passive and active motion. There are no carotid bruits on auscultation. Oropharynx exam reveals: mild mouth dryness, adequate dental hygiene. Mallampati is class I. Tongue protrudes centrally and palate elevates symmetrically.   Chest: Clear to auscultation without wheezing, rhonchi or crackles noted.  Heart: S1+S2+0, regular and normal without murmurs, rubs or gallops noted.   Abdomen: Soft, non-tender and non-distended with normal bowel sounds appreciated on auscultation.  Extremities: There is no pitting edema in the distal lower extremities bilaterally. Pedal pulses are intact.  Skin: Warm and dry without trophic changes noted. There are no varicose veins.  Musculoskeletal: exam  reveals no obvious joint deformities, tenderness or joint swelling or erythema.   Neurologically:  Mental status: The patient is awake, alert and oriented in all 4 spheres. His immediate and remote memory, attention, language skills and fund of knowledge are appropriate. There is no evidence of aphasia, agnosia, apraxia or anomia. Speech is clear with normal prosody and enunciation. Thought process is linear. Mood is normal and affect is normal.  Cranial nerves II - XII are as described above under HEENT exam. In addition: shoulder shrug is normal with equal shoulder height noted. Motor exam: Normal bulk, strength and tone is noted. There is no drift, tremor or rebound. Romberg is negative. Reflexes are 2+ throughout. Babinski: Toes are flexor bilaterally. Fine motor skills and coordination: intact with normal finger taps, normal hand movements, normal rapid alternating patting, normal foot taps and normal foot agility.  Cerebellar testing: No dysmetria or intention tremor on finger to nose testing. Heel to shin is unremarkable bilaterally. There is no truncal or gait ataxia.  Sensory exam: intact to  light touch, pinprick, vibration, temperature sense in the upper and lower extremities.  Gait, station and balance: He stands easily. No veering to one side is noted. No leaning to one side is noted. Posture is age-appropriate and stance is narrow based. Gait shows normal stride length and normal pace. No problems turning are noted. He turns en bloc. Tandem walk is slightly difficult for him.   Assessment and Plan:   In summary, Thad Osoria Resor is a very pleasant 54 y.o.-year old male with an underlying medical history of poorly controlled diabetes (latest A1c above 14), who reports recurrent headaches and intermittent generalized weakness for the past several months. His headaches started when he was placed on metformin and improved when he was off of it. He had stopped metformin on his own because of  headaches. He was restarted on metformin recently and feels that his headaches have become worse again. Thankfully, his recent head CT without contrast was normal and his neurological exam is nonfocal. He has in particular on examination no evidence of weakness or numbness. He does have mild issues with tandem walk. Today, I mostly reassured him. Nevertheless, he is encouraged to discuss alternative diabetes medications to metformin with you. His headaches do not appear to be migrainous in nature. However, because of his recurrent headaches and risk factor profile, I would like to do a brain MRI without contrast. He is encouraged to pursue better diabetes control and drink plenty of water and eat a healthy diet. We will call him with his brain MRI results. He is advised to use over-the-counter Tylenol, alternating with Advil or Motrin for symptomatic treatment of his headaches but I think in the long run he would benefit from changing his metformin. I talked to the patient about headache triggers. I suggested no prescription medication at this time. I answered all their questions today and the patient and his wife were in agreement with the above outlined plan. I would like to see the patient back in 3 months, sooner if the need arises and we will be in touch over the phone regarding his brain MRI results. I encouraged him to call or email with any interim questions or concerns.  Thank you very much for allowing me to participate in the care of this nice patient. If I can be of any further assistance to you please do not hesitate to call me at [phone].  Sincerely,   Huston Foley, MD, PhD

## 2015-07-21 NOTE — Patient Instructions (Addendum)
Please talk to Dr. Neva Seat about an alternative medication to metformin, as it may be causing her headaches. Thankfully, your neurological exam is nonfocal. You do seem to have mild cataracts. Because of your diabetes, I would suggest that you get a full eye exam to make sure you don't have retinopathy or complications of your eye background with your diabetes.   Please remember, common headache triggers are: sleep deprivation, dehydration, overheating, stress, hypoglycemia or skipping meals and blood sugar fluctuations, excessive pain medications or excessive alcohol use or caffeine withdrawal. Some people have food triggers such as aged cheese, orange juice or chocolate, especially dark chocolate, or MSG (monosodium glutamate). Try to avoid these headache triggers as much possible. It may be helpful to keep a headache diary to figure out what makes your headaches worse or brings them on and what alleviates them. Some people report headache onset after exercise but studies have shown that regular exercise may actually prevent headaches from coming. If you have exercise-induced headaches, please make sure that you drink plenty of fluid before and after exercising and that you do not over do it and do not overheat.  For now, I would suggest you take Tylenol and alternate with an anti-inflammatory medications such as Motrin or Advil to help with headaches as needed.  I would like to proceed with a brain MRI without contrast. I reviewed your head CT and it looked normal. We will call you with the results and I will see you back routinely.

## 2015-07-22 NOTE — Progress Notes (Signed)
Diabetes Self-Management Education  Visit Type: First/Initial  Appt. Start Time: 0915 Appt. End Time: 1045  07/22/2015  Mr. Terrence Myers, identified by name and date of birth, is a 54 y.o. male with a diagnosis of Diabetes: Type 2.   ASSESSMENT  Height 5\' 8"  (1.727 m), weight 156 lb 3.2 oz (70.852 kg). Body mass index is 23.76 kg/(m^2).      Diabetes Self-Management Education - 07/22/15 1354    Visit Information   Visit Type First/Initial   Initial Visit   Diabetes Type Type 2   Are you currently following a meal plan? No   Are you taking your medications as prescribed? Yes   Date Diagnosed 5 years   Health Coping   How would you rate your overall health? Fair   Psychosocial Assessment   Patient Belief/Attitude about Diabetes Motivated to manage diabetes   Self-care barriers English as a second language   Self-management support Family   Other persons present Patient;Spouse/SO   Patient Concerns Nutrition/Meal planning   Preferred Learning Style Auditory   Learning Readiness Ready   How often do you need to have someone help you when you read instructions, pamphlets, or other written materials from your doctor or pharmacy? 1 - Never   What is the last grade level you completed in school? elementary school   Complications   Last HgB A1C per patient/outside source 14 %   How often do you check your blood sugar? 1-2 times/day   Fasting Blood glucose range (mg/dL) >161   Postprandial Blood glucose range (mg/dL) >096   Number of hypoglycemic episodes per month 0   Have you had a dilated eye exam in the past 12 months? No   Have you had a dental exam in the past 12 months? No   Are you checking your feet? Yes   Dietary Intake   Breakfast oatmeal, fresh fruit and eggs OR omelet with sweet potato   Snack (morning) fresh fruit, nuts   Lunch hot meal with meat, vegetables, beans, OR chicken salad with vegetables, fresh fruit   Snack (afternoon) fresh fruit, nuts, yogurt   Dinner meat, tortillas, vegetables, rice   Beverage(s) coffee with creamer, unsweet tea,    Exercise   Exercise Type Moderate (swimming / aerobic walking)  Moderate (swimming / aerobic walking)   How many days per week to you exercise? 5   How many minutes per day do you exercise? 120   Total minutes per week of exercise 600   Patient Education   Previous Diabetes Education Yes (please comment)  [place]   Disease state  Definition of diabetes, type 1 and 2, and the diagnosis of diabetes   Nutrition management  Role of diet in the treatment of diabetes and the relationship between the three main macronutrients and blood glucose level;Food label reading, portion sizes and measuring food.;Carbohydrate counting   Physical activity and exercise  Role of exercise on diabetes management, blood pressure control and cardiac health.   Medications Reviewed patients medication for diabetes, action, purpose, timing of dose and side effects.   Monitoring Purpose and frequency of SMBG.;Identified appropriate SMBG and/or A1C goals.   Chronic complications Relationship between chronic complications and blood glucose control   Psychosocial adjustment Role of stress on diabetes   Individualized Goals (developed by patient)   Nutrition Follow meal plan discussed;General guidelines for healthy choices and portions discussed   Physical Activity Exercise 5-7 days per week   Monitoring  test my blood glucose as  discussed;test blood glucose pre and post meals as discussed   Outcomes   Expected Outcomes Demonstrated interest in learning. Expect positive outcomes   Future DMSE PRN   Program Status Completed      Individualized Plan for Diabetes Self-Management Training:   Learning Objective:  Patient will have a greater understanding of diabetes self-management. Patient education plan is to attend individual and/or group sessions per assessed needs and concerns.   Plan:   Patient Instructions  Plan:  Aim  for 4 Carb Choices per meal (60 grams) +/- 1 either way  Aim for 0-2 Carbs per snack if hungry  Include protein in moderation with your meals and snacks Consider reading food labels for Total Carbohydrate of foods Continue with your activity level daily as tolerated Consider checking BG at alternate times per day   Continue taking Diabetes medication Metformin as directed by MD      Expected Outcomes:  Demonstrated interest in learning. Expect positive outcomes  Education material provided: Living Well with Diabetes, A1C conversion sheet, Meal plan card and Carbohydrate counting sheet, DM 2 Support Group Flyer  If problems or questions, patient to contact team via:  Phone and Email  Future DSME appointment: PRN

## 2015-08-10 ENCOUNTER — Ambulatory Visit (INDEPENDENT_AMBULATORY_CARE_PROVIDER_SITE_OTHER): Payer: No Typology Code available for payment source

## 2015-08-10 DIAGNOSIS — R51 Headache: Secondary | ICD-10-CM

## 2015-08-10 DIAGNOSIS — R519 Headache, unspecified: Secondary | ICD-10-CM

## 2015-08-10 DIAGNOSIS — R531 Weakness: Secondary | ICD-10-CM

## 2015-08-10 DIAGNOSIS — E1165 Type 2 diabetes mellitus with hyperglycemia: Secondary | ICD-10-CM

## 2015-08-10 DIAGNOSIS — IMO0002 Reserved for concepts with insufficient information to code with codable children: Secondary | ICD-10-CM

## 2015-08-12 NOTE — Progress Notes (Signed)
Quick Note:  Please call and advise the patient that the recent scans we did was within normal limits. We did a brain MRI wo contrast, which showed normal findings. No further action is required on this test at this time. Please remind patient to keep any upcoming appointments or tests and to call us with any interim questions, concerns, problems or updates. Thanks,  Huston Foley, MD, PhD  ______

## 2015-08-13 ENCOUNTER — Ambulatory Visit (INDEPENDENT_AMBULATORY_CARE_PROVIDER_SITE_OTHER): Payer: No Typology Code available for payment source | Admitting: Family Medicine

## 2015-08-13 VITALS — BP 118/70 | HR 65 | Temp 97.6°F | Resp 16 | Ht 68.5 in | Wt 158.2 lb

## 2015-08-13 DIAGNOSIS — E1141 Type 2 diabetes mellitus with diabetic mononeuropathy: Secondary | ICD-10-CM | POA: Diagnosis not present

## 2015-08-13 LAB — GLUCOSE, POCT (MANUAL RESULT ENTRY): POC Glucose: 193 mg/dl — AB (ref 70–99)

## 2015-08-13 LAB — POCT GLYCOSYLATED HEMOGLOBIN (HGB A1C): Hemoglobin A1C: 12.5

## 2015-08-13 MED ORDER — GLIMEPIRIDE 2 MG PO TABS: 2.0000 mg | ORAL_TABLET | Freq: Every day | ORAL | Status: DC

## 2015-08-13 MED ORDER — GLUCOSE BLOOD VI STRP: ORAL_STRIP | Status: AC

## 2015-08-13 NOTE — Progress Notes (Signed)
Patient ID: Terrence Myers, male    DOB: 28-Oct-1961  Age: 54 y.o. MRN: [id number]  Chief Complaint  Patient presents with  . Diabetes    follow up/Fasting  . Numbness    to the right upper extremity x's 2 weeks    Subjective:   Patient came in today for a follow-up on his diabetes. He has a [facility] brand glucose meter, but needs a prescription to get test strips. He is now taking the metformin 500 mg pills 2 pills twice daily. He gets a little fluctuation of his vision. No headaches or dizziness now. He is seeing a neurologist who did a scan of his head which was normal. He has no chest pain or shortness of breath. No GI or GU complaints. He does have a little area of numbness on his right thigh just above the knee subjectively feels like an area of dry skin to him. He is just noticed that the last several weeks, did not discuss this with the neurologist. Dermatologic otherwise unremarkable. Extremities unremarkable.  Current allergies, medications, problem list, past/family and social histories reviewed.  Objective:  BP 118/70 mmHg  Pulse 65  Temp(Src) 97.6 F (36.4 C) (Oral)  Resp 16  Ht 5' 8.5" (1.74 m)  Wt 158 lb 3.2 oz (71.759 kg)  BMI 23.70 kg/m2  SpO2 99%  No acute distress. Alert and oriented. Fundi appear benign. TMs partially occluded by cerumen. Throat clear. Neck supple without nodes or thyromegaly. No carotid bruits. Chest clear to auscultation. Heart regular without murmurs. And soft without masses tenderness. Extremities without edema. Pedal pulses good. Sensory normal in his feet. Cannot on exam detects the area of numbness, though he subjectively feels it.  Assessment & Plan:   Assessment: 1. Type 2 diabetes mellitus with diabetic mononeuropathy       Plan: Orders Placed This Encounter  Procedures  . Ambulatory referral to Ophthalmology    Referral Priority:  Routine    Referral Type:  Consultation    Referral Reason:  Specialty Services Required   Requested Specialty:  Ophthalmology    Number of Visits Requested:  1  . POCT glucose (manual entry)  . POCT glycosylated hemoglobin (Hb A1C)    Meds ordered this encounter  Medications  . glucose blood test strip    Sig: Use as instructed    Dispense:  100 each    Refill:  12    Results for orders placed or performed in visit on 08/13/15  POCT glucose (manual entry)  Result Value Ref Range   POC Glucose 193 (A) 70 - 99 mg/dl  POCT glycosylated hemoglobin (Hb A1C)  Result Value Ref Range   Hemoglobin A1C 12.5         Patient Instructions  Regularly check your blood sugars several times a week, before breakfast and about 2 hours after the main meal. Keep a record of these to show [place] at your visits.  Continue to try and get enough exercise and to avoid excessive starches in your diet. (Breads, pastas, potatoes, rice)  Look online and read the American Diabetic Association website. It has a lot of good teaching information for you to learn about diabetes  Next time you see the neurologist tell the neurologist about the area on the thigh  Return in 3 months for recheck  Advise all diabetics to see an eye doctor about once a year for a diabetic check. Usually insurance will help pay for this.     Return in  about 3 months (around 11/12/2015).   HOPPER,DAVID, MD 08/13/2015

## 2015-08-13 NOTE — Patient Instructions (Addendum)
Regularly check your blood sugars several times a week, before breakfast and about 2 hours after the main meal. Keep a record of these to show [place] at your visits.  Continue to try and get enough exercise and to avoid excessive starches in your diet. (Breads, pastas, potatoes, rice)  Look online and read the American Diabetic Association website. It has a lot of good teaching information for you to learn about diabetes  Next time you see the neurologist tell the neurologist about the area on the thigh  Return in 2 months for recheck  Advise all diabetics to see an eye doctor about once a year for a diabetic check. Usually insurance will help pay for this.  Begin glimepiride (Amaryl) 2 mg 1 each morning for diabetes  If you ever think your sugars are going to low eat something, but if you have your glucose test kit available test her sugar at that time also.

## 2015-08-15 ENCOUNTER — Telehealth: Payer: Self-pay

## 2015-08-15 NOTE — Telephone Encounter (Signed)
-----   Message from Huston Foley, MD sent at 08/12/2015 10:24 AM EDT ----- Please call and advise the patient that the recent scans we did was within normal limits. We did a brain MRI wo contrast, which showed normal findings. No further action is required on this test at this time. Please remind patient to keep any upcoming appointments or tests and to call us with any interim questions, concerns, problems or updates. Thanks,  Huston Foley, MD, PhD

## 2015-08-15 NOTE — Telephone Encounter (Signed)
LM on vm that scans and MRI are normal and call back if any further questions. Also stated that he needs to keep up coming appt and no further tests are required at this time.

## 2015-08-22 ENCOUNTER — Telehealth: Payer: Self-pay

## 2015-08-22 MED ORDER — METFORMIN HCL 500 MG PO TABS: 500.0000 mg | ORAL_TABLET | Freq: Two times a day (BID) | ORAL | Status: DC

## 2015-08-22 NOTE — Telephone Encounter (Signed)
He has a refill

## 2015-08-22 NOTE — Telephone Encounter (Signed)
Pt requesting Metformin from Dr Lambert Keto phone for pt is [phone]   [facility] [address]

## 2015-08-22 NOTE — Telephone Encounter (Signed)
Left message for pt to call back. I called the pharmacy and they just needed the RX transferred from [facility] to [place]. Done.

## 2015-08-31 LAB — HM DIABETES EYE EXAM

## 2015-09-09 ENCOUNTER — Other Ambulatory Visit: Payer: Self-pay | Admitting: Family Medicine

## 2015-10-14 ENCOUNTER — Other Ambulatory Visit: Payer: Self-pay | Admitting: Family Medicine

## 2015-10-24 ENCOUNTER — Telehealth: Payer: Self-pay

## 2015-10-24 ENCOUNTER — Ambulatory Visit: Payer: No Typology Code available for payment source | Admitting: Neurology

## 2015-10-24 NOTE — Telephone Encounter (Signed)
Patient did not show to appt today  

## 2015-10-26 ENCOUNTER — Encounter: Payer: Self-pay | Admitting: Neurology

## 2015-11-18 ENCOUNTER — Other Ambulatory Visit: Payer: Self-pay | Admitting: Family Medicine

## 2015-12-07 ENCOUNTER — Other Ambulatory Visit: Payer: Self-pay | Admitting: Family Medicine

## 2015-12-14 ENCOUNTER — Other Ambulatory Visit: Payer: Self-pay | Admitting: Family Medicine

## 2015-12-15 ENCOUNTER — Telehealth: Payer: Self-pay

## 2015-12-15 DIAGNOSIS — E1141 Type 2 diabetes mellitus with diabetic mononeuropathy: Secondary | ICD-10-CM

## 2015-12-15 NOTE — Telephone Encounter (Signed)
Pt is needing to change his pharmacy from [facility] to [facility].  The meds are metformin and glimepiride  Best number [phone]

## 2015-12-16 NOTE — Telephone Encounter (Signed)
Return in 2 months for recheck  Advise all diabetics to see an eye doctor about once a year for a diabetic check. Usually insurance will help pay for this.  Begin glimepiride (Amaryl) 2 mg 1 each morning for diabetes  If you ever think your sugars are going to low eat something, but if you have your glucose test kit available test her sugar at that time also.  Advised pt to RTC. Pt will come in tomorrow to follow up. 

## 2015-12-20 ENCOUNTER — Ambulatory Visit (INDEPENDENT_AMBULATORY_CARE_PROVIDER_SITE_OTHER): Payer: BLUE CROSS/BLUE SHIELD | Admitting: Family Medicine

## 2015-12-20 VITALS — BP 120/76 | HR 63 | Temp 98.3°F | Resp 16 | Ht 67.5 in | Wt 170.2 lb

## 2015-12-20 DIAGNOSIS — E119 Type 2 diabetes mellitus without complications: Secondary | ICD-10-CM | POA: Diagnosis not present

## 2015-12-20 DIAGNOSIS — Z13220 Encounter for screening for lipoid disorders: Secondary | ICD-10-CM

## 2015-12-20 LAB — COMPLETE METABOLIC PANEL WITH GFR
ALT: 11 U/L (ref 9–46)
AST: 11 U/L (ref 10–35)
Albumin: 4.5 g/dL (ref 3.6–5.1)
Alkaline Phosphatase: 44 U/L (ref 40–115)
BILIRUBIN TOTAL: 0.8 mg/dL (ref 0.2–1.2)
BUN: 10 mg/dL (ref 7–25)
CO2: 24 mmol/L (ref 20–31)
CREATININE: 0.64 mg/dL — AB (ref 0.70–1.33)
Calcium: 9.5 mg/dL (ref 8.6–10.3)
Chloride: 100 mmol/L (ref 98–110)
GFR, Est African American: >89 mL/min (ref >=60)
GFR, Est Non African American: >89 mL/min (ref >=60)
GLUCOSE: 238 mg/dL — AB (ref 65–99)
Potassium: 4.1 mmol/L (ref 3.5–5.3)
SODIUM: 134 mmol/L — AB (ref 135–146)
Total Protein: 7.1 g/dL (ref 6.1–8.1)

## 2015-12-20 LAB — LIPID PANEL
Cholesterol: 210 mg/dL — ABNORMAL HIGH (ref 125–200)
HDL: 57 mg/dL (ref >=40)
LDL CALC: 134 mg/dL — AB (ref <130)
Total CHOL/HDL Ratio: 3.7 Ratio (ref <=5.0)
Triglycerides: 95 mg/dL (ref <150)
VLDL: 19 mg/dL (ref <30)

## 2015-12-20 LAB — GLUCOSE, POCT (MANUAL RESULT ENTRY): POC Glucose: 226 mg/dl — AB (ref 70–99)

## 2015-12-20 LAB — POCT GLYCOSYLATED HEMOGLOBIN (HGB A1C): Hemoglobin A1C: 8.3

## 2015-12-20 MED ORDER — GLIMEPIRIDE 2 MG PO TABS: 2.0000 mg | ORAL_TABLET | Freq: Every day | ORAL | Status: DC

## 2015-12-20 MED ORDER — METFORMIN HCL 850 MG PO TABS: 850.0000 mg | ORAL_TABLET | Freq: Two times a day (BID) | ORAL | Status: DC

## 2015-12-20 NOTE — Patient Instructions (Signed)
Diabetes control is improved, but still not at goal. Increase metformin to 850 mg pill 1 twice per day. Continue the Amaryl at the same dose once per day. Follow-up with me in 3 months, sooner if worse. You'll receive a letter or phone call about your lab results within the next week to 10 days.  Return to the clinic or go to the nearest emergency room if any of your symptoms worsen or new symptoms occur.

## 2015-12-20 NOTE — Progress Notes (Addendum)
Subjective:  This chart was scribed for Meredith Staggers MD, by Veverly Fells, at [facility].  This patient was seen in room 3 and the patient's care was started at 9:19 AM.    Patient ID: Terrence Myers, male    DOB: Apr 08, 1961, 55 y.o.   MRN: [id number] Chief Complaint  Patient presents with  . Follow-up    Diabetes    HPI  HPI Comments: Terrence Myers is a 55 y.o. male who presents to the [facility] for a follow up regarding his diabetes. Patient is complaint with his Metformin (twice a day- 500 mg) as well as his Amaryl.   When he took his 1000 mg (twice a day) he was doing fine, but then switched back to 500 mg after seeing Dr. Alwyn Ren.  For the past 3 months, he has only been taking 500 mg of Metformin as well as his Amaryl.  Patient denies any chest pain/shortness of breath. He last saw his eye doctor two months ago and was told that there was no sign of damage. He has only had coffee this morning (without sugar).  Patient is not taking any statins currently.  Per wife, patient has put on close to 30 pounds since his last hospital visit and feels like he looks "much more like himself".    Headache: He states that he had to go to the dentist recent for a tooth removal which took care of his headaches.     ----- Last seen by me in August of 2016 for diabetes, Uncontrolled at that time. Increased metformin to 1000 mg BID. Was referred to diabetic nutrition.  He was seen by neurology due to recurrent headaches.  Possible correlation between his headaches and metformin.  Reccommended  better diabetes control, increased water, MRI of brain- which was normal. Considered dif medication than metformin.  He was seen Aug 13 2015.  It appears he was on Amaryl at that time. 2 mg QD- for diabetes. It appears he was continued on his Metformin.    Lab Results  Component Value Date   HGBA1C 12.5 08/13/2015     Patient Active Problem List   Diagnosis  Date Noted  . Type 2 diabetes mellitus (HCC) 12/20/2015   Past Medical History  Diagnosis Date  . Diabetes mellitus without complication (HCC)   . Depression    Past Surgical History  Procedure Laterality Date  . Hernia repair     No Known Allergies Prior to Admission medications   Medication Sig Start Date End Date Taking? Authorizing Provider  acetaminophen (TYLENOL) 325 MG tablet Take 650 mg by mouth every 6 (six) hours as needed.   Yes Historical Provider, MD  glimepiride (AMARYL) 2 MG tablet Take 1 tablet (2 mg total) by mouth daily before breakfast. 08/13/15  Yes Peyton Najjar, MD  glucose blood test strip Use as instructed 08/13/15  Yes Peyton Najjar, MD  metFORMIN (GLUCOPHAGE) 500 MG tablet TAKE 1 TABLET BY MOUTH TWICE DAILY WITH A MEAL 12/07/15  Yes Peyton Najjar, MD   Social History   Social History  . Marital Status: Married    Spouse Name: N/A  . Number of Children: N/A  . Years of Education: Elementary   Occupational History  . N/A    Social History Main Topics  . Smoking status: Former Games developer  . Smokeless tobacco: Not on file  . Alcohol Use: No  . Drug Use: No  .  Sexual Activity: Not on file   Other Topics Concern  . Not on file   Social History Narrative    Review of Systems  Constitutional: Negative for fever and chills.  Eyes: Negative for pain, redness and itching.  Respiratory: Negative for cough, choking and shortness of breath.   Cardiovascular: Negative for chest pain.  Gastrointestinal: Negative for nausea and vomiting.  Neurological: Negative for syncope and speech difficulty.       Objective:   Physical Exam  Constitutional: He is oriented to person, place, and time. He appears well-developed and well-nourished.  HENT:  Head: Normocephalic and atraumatic.  Eyes: EOM are normal. Pupils are equal, round, and reactive to light.  Neck: No JVD present. Carotid bruit is not present.  Cardiovascular: Normal rate, regular rhythm and normal  heart sounds.   No murmur heard. Pulmonary/Chest: Effort normal and breath sounds normal. He has no rales.  Musculoskeletal: He exhibits no edema.  Neurological: He is alert and oriented to person, place, and time.  Skin: Skin is warm and dry.  Psychiatric: He has a normal mood and affect.  Vitals reviewed.   Filed Vitals:   12/20/15 0839  BP: 120/76  Pulse: 63  Temp: 98.3 F (36.8 C)  TempSrc: Oral  Resp: 16  Height: 5' 7.5" (1.715 m)  Weight: 170 lb 3.2 oz (77.202 kg)  SpO2: 98%   Results for orders placed or performed in visit on 12/20/15  POCT glucose (manual entry)  Result Value Ref Range   POC Glucose 226 (A) 70 - 99 mg/dl  POCT glycosylated hemoglobin (Hb A1C)  Result Value Ref Range   Hemoglobin A1C 8.3       Assessment & Plan:   Terrence Myers is a 55 y.o. male Type 2 diabetes mellitus without complication, without long-term current use of insulin (HCC) - Plan: COMPLETE METABOLIC PANEL WITH GFR, POCT glucose (manual entry), POCT glycosylated hemoglobin (Hb A1C), Microalbumin, urine, metFORMIN (GLUCOPHAGE) 850 MG tablet, glimepiride (AMARYL) 2 MG tablet  - Decreased control. But improved from previous. Increase metformin to 850 mg twice a day, continue Amaryl 2 mg daily. Urine microalbumin pending. Labs pending.  Screening for hyperlipidemia - Plan: Lipid panel, COMPLETE METABOLIC PANEL WITH GFR  -Lipids pending. Likely will need statin.  Follow-up in 3 months.  Meds ordered this encounter  Medications  . metFORMIN (GLUCOPHAGE) 850 MG tablet    Sig: Take 1 tablet (850 mg total) by mouth 2 (two) times daily with a meal.    Dispense:  180 tablet    Refill:  1  . glimepiride (AMARYL) 2 MG tablet    Sig: Take 1 tablet (2 mg total) by mouth daily before breakfast.    Dispense:  30 tablet    Refill:  3   Patient Instructions  Diabetes control is improved, but still not at goal. Increase metformin to 850 mg pill 1 twice per day. Continue the Amaryl at the  same dose once per day. Follow-up with me in 3 months, sooner if worse. You'll receive a letter or phone call about your lab results within the next week to 10 days.  Return to the clinic or go to the nearest emergency room if any of your symptoms worsen or new symptoms occur.       I personally performed the services described in this documentation, which was scribed in my presence. The recorded information has been reviewed and considered, and addended by me as needed.

## 2016-01-15 ENCOUNTER — Other Ambulatory Visit: Payer: Self-pay | Admitting: Family Medicine

## 2016-01-15 DIAGNOSIS — E785 Hyperlipidemia, unspecified: Secondary | ICD-10-CM

## 2016-01-15 MED ORDER — ATORVASTATIN CALCIUM 10 MG PO TABS: 10.0000 mg | ORAL_TABLET | Freq: Every day | ORAL | Status: DC

## 2016-02-29 ENCOUNTER — Encounter: Payer: Self-pay | Admitting: Family Medicine

## 2016-02-29 ENCOUNTER — Ambulatory Visit (INDEPENDENT_AMBULATORY_CARE_PROVIDER_SITE_OTHER): Payer: BLUE CROSS/BLUE SHIELD | Admitting: Family Medicine

## 2016-02-29 VITALS — BP 128/82 | HR 71 | Temp 97.7°F | Resp 16 | Ht 67.5 in | Wt 174.6 lb

## 2016-02-29 DIAGNOSIS — E785 Hyperlipidemia, unspecified: Secondary | ICD-10-CM | POA: Diagnosis not present

## 2016-02-29 DIAGNOSIS — K05219 Aggressive periodontitis, localized, unspecified severity: Secondary | ICD-10-CM | POA: Diagnosis not present

## 2016-02-29 DIAGNOSIS — E1165 Type 2 diabetes mellitus with hyperglycemia: Secondary | ICD-10-CM | POA: Diagnosis not present

## 2016-02-29 DIAGNOSIS — E119 Type 2 diabetes mellitus without complications: Secondary | ICD-10-CM | POA: Diagnosis not present

## 2016-02-29 DIAGNOSIS — IMO0001 Reserved for inherently not codable concepts without codable children: Secondary | ICD-10-CM

## 2016-02-29 LAB — POCT GLYCOSYLATED HEMOGLOBIN (HGB A1C): Hemoglobin A1C: 8

## 2016-02-29 LAB — COMPLETE METABOLIC PANEL WITH GFR
ALBUMIN: 4.5 g/dL (ref 3.6–5.1)
ALK PHOS: 42 U/L (ref 40–115)
ALT: 10 U/L (ref 9–46)
AST: 11 U/L (ref 10–35)
BUN: 10 mg/dL (ref 7–25)
CALCIUM: 9.1 mg/dL (ref 8.6–10.3)
CHLORIDE: 100 mmol/L (ref 98–110)
CO2: 21 mmol/L (ref 20–31)
CREATININE: 0.68 mg/dL — AB (ref 0.70–1.33)
GFR, Est African American: >89 mL/min (ref >=60)
GFR, Est Non African American: >89 mL/min (ref >=60)
GLUCOSE: 161 mg/dL — AB (ref 65–99)
POTASSIUM: 4 mmol/L (ref 3.5–5.3)
SODIUM: 136 mmol/L (ref 135–146)
Total Bilirubin: 0.6 mg/dL (ref 0.2–1.2)
Total Protein: 7 g/dL (ref 6.1–8.1)

## 2016-02-29 LAB — GLUCOSE, POCT (MANUAL RESULT ENTRY): POC Glucose: 173 mg/dl — AB (ref 70–99)

## 2016-02-29 LAB — LIPID PANEL
CHOL/HDL RATIO: 3.7 ratio (ref <=5.0)
CHOLESTEROL: 201 mg/dL — AB (ref 125–200)
HDL: 55 mg/dL (ref >=40)
LDL Cholesterol: 129 mg/dL (ref <130)
Triglycerides: 86 mg/dL (ref <150)
VLDL: 17 mg/dL (ref <30)

## 2016-02-29 MED ORDER — GLIMEPIRIDE 2 MG PO TABS: 2.0000 mg | ORAL_TABLET | Freq: Every day | ORAL | Status: DC

## 2016-02-29 MED ORDER — ATORVASTATIN CALCIUM 10 MG PO TABS: 10.0000 mg | ORAL_TABLET | Freq: Every day | ORAL | Status: AC

## 2016-02-29 MED ORDER — METFORMIN HCL 1000 MG PO TABS: 1000.0000 mg | ORAL_TABLET | Freq: Two times a day (BID) | ORAL | Status: DC

## 2016-02-29 MED ORDER — AMOXICILLIN-POT CLAVULANATE 875-125 MG PO TABS
1.0000 | ORAL_TABLET | Freq: Two times a day (BID) | ORAL | Status: DC
Start: 2016-02-29 — End: 2016-07-05

## 2016-02-29 NOTE — Progress Notes (Addendum)
Subjective:    Patient ID: Terrence Myers, male    DOB: [birth year], 55 y.o.   MRN: [id number] By signing my name below, I, Littie Deeds, attest that this documentation has been prepared under the direction and in the presence of Meredith Staggers, MD.  Electronically Signed: Littie Deeds, Medical Scribe. 02/29/2016. 9:06 AM.  HPI HPI Comments: Terrence Myers is a 55 y.o. male with a history of DM who presents to the [facility] for a follow-up.   Diabetes: At last visit, he was having improving numbers but still uncontrolled. We increased his metformin from 500 mg BID to 850 mg BID. Continued Amaryl 2 mg daily. Previously uncontrolled diabetes. See prior notes. Patient denies symptomatic lows. He last saw an eye doctor about a year ago. He reports exercising very little. His last dentist visit was about a year ago. No diarrhea or side effects with metformin.   Lab Results  Component Value Date   HGBA1C 8.3 12/20/2015   Hyperlipidemia: Started on Lipitor 10 mg qd at last visit. Ran out awhile ago.  No side effects when on it.   Blisters on gums: He is also complaining of mouth rash today. He has had painful blisters on the gums inside of the left upper side of his mouth over the past few weeks. The blisters will go away, then come back. Patient does report having some cold sensitivity in his teeth. He has been taking ibuprofen for the pain. He has had similar blisters on the right side when he had a tooth infection. Patient denies fevers, pus drainage, and bad taste in his mouth. He has not seen a dentist for this.   Patient Active Problem List   Diagnosis Date Noted  . Type 2 diabetes mellitus (HCC) 12/20/2015   Past Medical History  Diagnosis Date  . Diabetes mellitus without complication (HCC)   . Depression    Past Surgical History  Procedure Laterality Date  . Hernia repair     No Known Allergies Prior to Admission medications   Medication Sig Start Date  End Date Taking? Authorizing Provider  acetaminophen (TYLENOL) 325 MG tablet Take 650 mg by mouth every 6 (six) hours as needed.   Yes Historical Provider, MD  atorvastatin (LIPITOR) 10 MG tablet Take 1 tablet (10 mg total) by mouth daily. 01/15/16  Yes Shade Flood, MD  glimepiride (AMARYL) 2 MG tablet Take 1 tablet (2 mg total) by mouth daily before breakfast. 12/20/15  Yes Shade Flood, MD  glucose blood test strip Use as instructed 08/13/15  Yes Peyton Najjar, MD  metFORMIN (GLUCOPHAGE) 850 MG tablet Take 1 tablet (850 mg total) by mouth 2 (two) times daily with a meal. 12/20/15  Yes Shade Flood, MD   Social History   Social History  . Marital Status: Married    Spouse Name: N/A  . Number of Children: N/A  . Years of Education: Elementary   Occupational History  . N/A    Social History Main Topics  . Smoking status: Former Games developer  . Smokeless tobacco: Not on file  . Alcohol Use: No  . Drug Use: No  . Sexual Activity: Not on file   Other Topics Concern  . Not on file   Social History Narrative     Review of Systems  Constitutional: Negative for fever.  HENT: Positive for dental problem.   Neurological: Negative for dizziness, syncope and light-headedness.  Objective:   Physical Exam  Constitutional: He is oriented to person, place, and time. He appears well-developed and well-nourished.  HENT:  Head: Normocephalic and atraumatic.  Appears to be slight decay along the 3rd molar on the upper left. Small draining periodontal abscess abscess in between the 1st and 2nd upper molar. Draining small amount of blood and pus. No facial swelling. No swelling at the jaw line.  Eyes: EOM are normal. Pupils are equal, round, and reactive to light.  Neck: No JVD present. Carotid bruit is not present.  Cardiovascular: Normal rate, regular rhythm and normal heart sounds.   No murmur heard. Pulmonary/Chest: Effort normal and breath sounds normal. He has no rales.    Musculoskeletal: He exhibits no edema.  Lymphadenopathy:    He has no cervical adenopathy.  Neurological: He is alert and oriented to person, place, and time.  Skin: Skin is warm and dry.  Psychiatric: He has a normal mood and affect.  Vitals reviewed.   Filed Vitals:   02/29/16 0829 02/29/16 0835  BP: 140/92 128/82  Pulse: 71   Temp: 97.7 F (36.5 C)   TempSrc: Oral   Resp: 16   Height: 5' 7.5" (1.715 m)   Weight: 174 lb 9.6 oz (79.198 kg)   SpO2: 99%     Results for orders placed or performed in visit on 02/29/16  POCT glucose (manual entry)  Result Value Ref Range   POC Glucose 173 (A) 70 - 99 mg/dl  POCT glycosylated hemoglobin (Hb A1C)  Result Value Ref Range   Hemoglobin A1C 8.0        Assessment & Plan:   Terrence Myers is a 55 y.o. male Uncontrolled type 2 diabetes mellitus without complication, without long-term current use of insulin (HCC) - Plan: HM Diabetes Foot Exam, POCT glucose (manual entry), POCT glycosylated hemoglobin (Hb A1C)  -Minimal improvement, still uncontrolled. Increase metformin to 1000 g twice a day, continue Amaryl 2 mg daily, continue diet and exercise. Recheck in 3 months.  Hyperlipidemia - Plan: COMPLETE METABOLIC PANEL WITH GFR, Lipid panel, atorvastatin (LIPITOR) 10 MG tablet  - Check CMP, lipid panel, continue Lipitor 10 mg daily for now.  Periodontal abscess - Plan: amoxicillin-clavulanate (AUGMENTIN) 875-125 MG tablet  - Mild, small area of abscess that easily drained. No surrounding soft tissue swelling or facial swelling, afebrile.   -Discussed possible need for dentist follow-up in the next few days if not improving, but can start Augmentin 875 mg twice a day for now. RTC precautions.  Meds ordered this encounter  Medications  . amoxicillin-clavulanate (AUGMENTIN) 875-125 MG tablet    Sig: Take 1 tablet by mouth 2 (two) times daily.    Dispense:  20 tablet    Refill:  0  . metFORMIN (GLUCOPHAGE) 1000 MG tablet    Sig:  Take 1 tablet (1,000 mg total) by mouth 2 (two) times daily with a meal.    Dispense:  180 tablet    Refill:  1  . glimepiride (AMARYL) 2 MG tablet    Sig: Take 1 tablet (2 mg total) by mouth daily before breakfast.    Dispense:  90 tablet    Refill:  1  . atorvastatin (LIPITOR) 10 MG tablet    Sig: Take 1 tablet (10 mg total) by mouth daily.    Dispense:  90 tablet    Refill:  1   Patient Instructions       IF you received an x-ray today, you will receive an  invoice from [facility]. Please contact [facility] at [phone] with questions or concerns regarding your invoice.   IF you received labwork today, you will receive an invoice from [facility]. Please contact [facility] at [phone] with questions or concerns regarding your invoice.   Our billing staff will not be able to assist you with questions regarding bills from these companies.  You will be contacted with the lab results as soon as they are available. The fastest way to get your results is to activate your My Chart account. Instructions are located on the last page of this paperwork. If you have not heard from us regarding the results in 2 weeks, please contact this office.    You do have a small abscess on the gum, below tooth.  Appears mild and draining already at this time. Start augmentin, but if not improved in next 2-3 days at the most - recommend that your dentist see you for treatment.  Sooner if increased pain or swelling.   Return to the clinic or go to the nearest emergency room if any of your symptoms worsen or new symptoms occur.    Dental Abscess A dental abscess is a collection of pus in or around a tooth. CAUSES This condition is caused by a bacterial infection around the root of the tooth that involves the inner part of the tooth (pulp). It may result from:  Severe tooth decay.  Trauma to the tooth that allows bacteria to enter into the pulp, such  as a broken or chipped tooth.  Severe gum disease around a tooth. SYMPTOMS Symptoms of this condition include:  Severe pain in and around the infected tooth.  Swelling and redness around the infected tooth, in the mouth, or in the face.  Tenderness.  Pus drainage.  Bad breath.  Bitter taste in the mouth.  Difficulty swallowing.  Difficulty opening the mouth.  Nausea.  Vomiting.  Chills.  Swollen neck glands.  Fever. DIAGNOSIS This condition is diagnosed with examination of the infected tooth. During the exam, your dentist may tap on the infected tooth. Your dentist will also ask about your medical and dental history and may order X-rays. TREATMENT This condition is treated by eliminating the infection. This may be done with:  Antibiotic medicine.  A root canal. This may be performed to save the tooth.  Pulling (extracting) the tooth. This may also involve draining the abscess. This is done if the tooth cannot be saved. HOME CARE INSTRUCTIONS  Take medicines only as directed by your dentist.  If you were prescribed antibiotic medicine, finish all of it even if you start to feel better.  Rinse your mouth (gargle) often with salt water to relieve pain or swelling.  Do not drive or operate heavy machinery while taking pain medicine.  Do not apply heat to the outside of your mouth.  Keep all follow-up visits as directed by your dentist. This is important. SEEK MEDICAL CARE IF:  Your pain is worse and is not helped by medicine. SEEK IMMEDIATE MEDICAL CARE IF:  You have a fever or chills.  Your symptoms suddenly get worse.  You have a very bad headache.  You have problems breathing or swallowing.  You have trouble opening your mouth.  You have swelling in your neck or around your eye.   This information is not intended to replace advice given to you by your health care provider. Make sure you discuss any questions you have with your health care  provider.   Document Released: 11/12/2005 Document Revised: 03/29/2015 Document Reviewed: 11/09/2014 Elsevier Interactive Patient Education [employer]     I personally performed the services described in this documentation, which was scribed in my presence. The recorded information has been reviewed and considered, and addended by me as needed.

## 2016-02-29 NOTE — Patient Instructions (Addendum)
IF you received an x-ray today, you will receive an invoice from [facility]. Please contact [facility] at [phone] with questions or concerns regarding your invoice.   IF you received labwork today, you will receive an invoice from [facility]. Please contact [facility] at [phone] with questions or concerns regarding your invoice.   Our billing staff will not be able to assist you with questions regarding bills from these companies.  You will be contacted with the lab results as soon as they are available. The fastest way to get your results is to activate your My Chart account. Instructions are located on the last page of this paperwork. If you have not heard from [place] regarding the results in 2 weeks, please contact this office.    You do have a small abscess on the gum, below tooth.  Appears mild and draining already at this time. Start augmentin, but if not improved in next 2-3 days at the most - recommend that your dentist see you for treatment.  Sooner if increased pain or swelling.   Return to the clinic or go to the nearest emergency room if any of your symptoms worsen or new symptoms occur.    Dental Abscess A dental abscess is a collection of pus in or around a tooth. CAUSES This condition is caused by a bacterial infection around the root of the tooth that involves the inner part of the tooth (pulp). It may result from:  Severe tooth decay.  Trauma to the tooth that allows bacteria to enter into the pulp, such as a broken or chipped tooth.  Severe gum disease around a tooth. SYMPTOMS Symptoms of this condition include:  Severe pain in and around the infected tooth.  Swelling and redness around the infected tooth, in the mouth, or in the face.  Tenderness.  Pus drainage.  Bad breath.  Bitter taste in the mouth.  Difficulty swallowing.  Difficulty opening the  mouth.  Nausea.  Vomiting.  Chills.  Swollen neck glands.  Fever. DIAGNOSIS This condition is diagnosed with examination of the infected tooth. During the exam, your dentist may tap on the infected tooth. Your dentist will also ask about your medical and dental history and may order X-rays. TREATMENT This condition is treated by eliminating the infection. This may be done with:  Antibiotic medicine.  A root canal. This may be performed to save the tooth.  Pulling (extracting) the tooth. This may also involve draining the abscess. This is done if the tooth cannot be saved. HOME CARE INSTRUCTIONS  Take medicines only as directed by your dentist.  If you were prescribed antibiotic medicine, finish all of it even if you start to feel better.  Rinse your mouth (gargle) often with salt water to relieve pain or swelling.  Do not drive or operate heavy machinery while taking pain medicine.  Do not apply heat to the outside of your mouth.  Keep all follow-up visits as directed by your dentist. This is important. SEEK MEDICAL CARE IF:  Your pain is worse and is not helped by medicine. SEEK IMMEDIATE MEDICAL CARE IF:  You have a fever or chills.  Your symptoms suddenly get worse.  You have a very bad headache.  You have problems breathing or swallowing.  You have trouble opening your mouth.  You have swelling in your neck or around your eye.   This information is not intended to replace advice given to you by your health care  provider. Make sure you discuss any questions you have with your health care provider.   Document Released: 11/12/2005 Document Revised: 03/29/2015 Document Reviewed: 11/09/2014 Elsevier Interactive Patient Education [employer]

## 2016-03-09 IMAGING — CT CT HEAD W/O CM
2 series · 16 of 30 positions shown, 20 images · non-contrast
Comparison: None.

CLINICAL DATA: Temporal headaches for 4 weeks

EXAM:
CT HEAD WITHOUT CONTRAST
TECHNIQUE: Contiguous axial images were obtained from the base of the skull
through the vertex without intravenous contrast.

[Series 3: head bone · axial · 0.49mm/px · z∈[+11,+52]mm · 3 of 28 slices shown]
[im 2/28  bone]
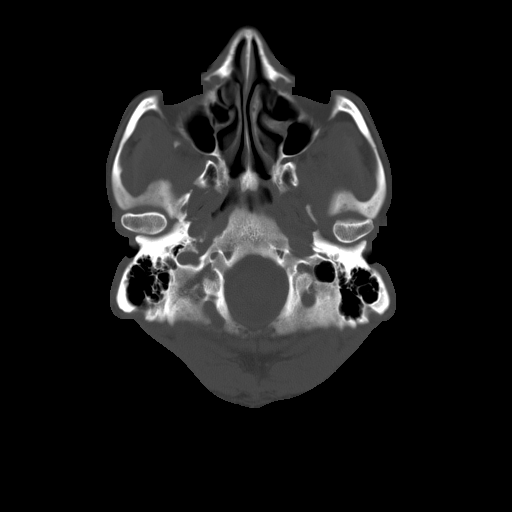
[im 6/28  bone]
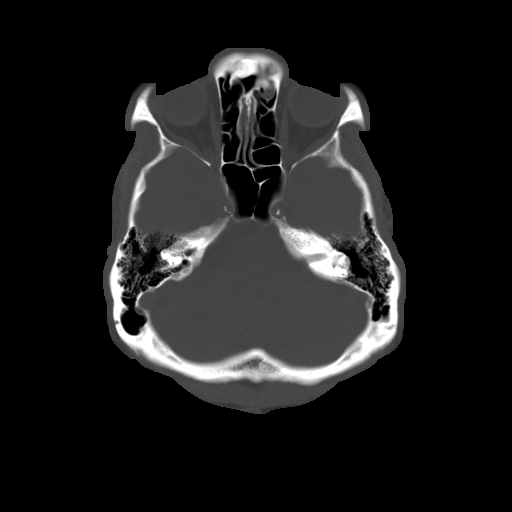
[im 10/28  bone]
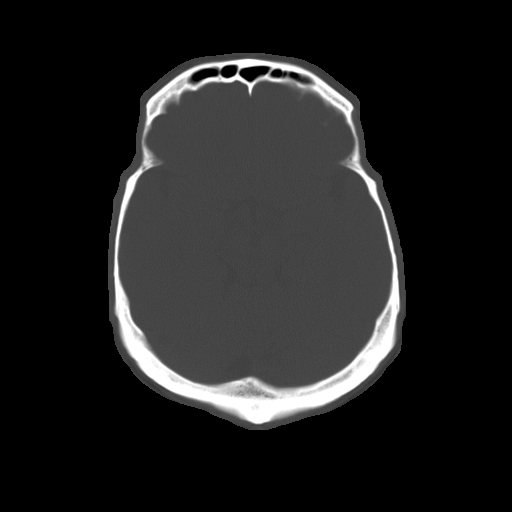

[Series 32: 3d filtered head w/o · axial · non-contrast · 0.49mm/px · z∈[+11,+134]mm · 13 of 28 slices shown, 17 images]
[im 2/28  brain]
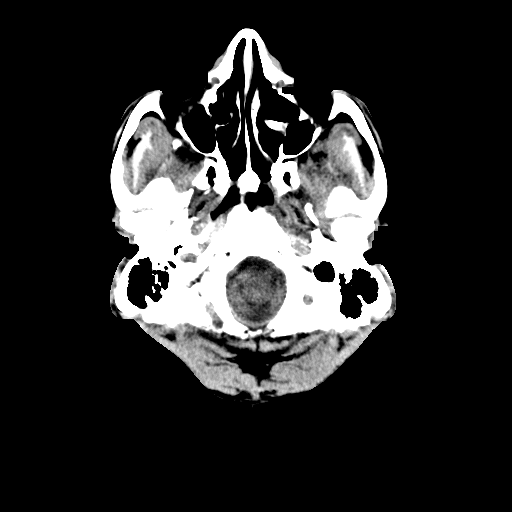
[im 2/28  bone]
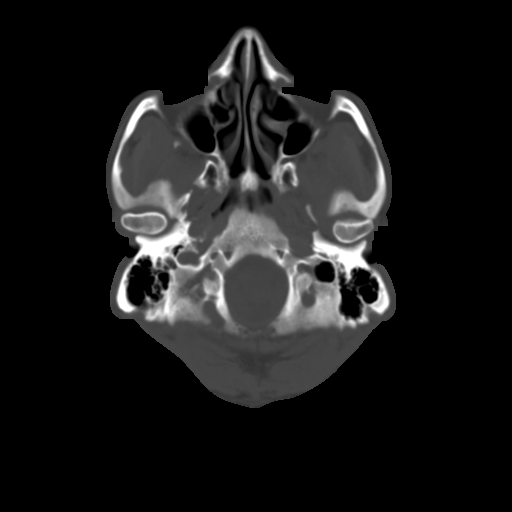
[im 4/28  brain]
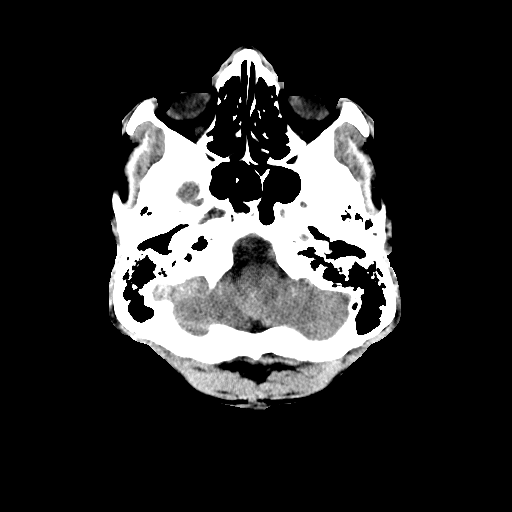
[im 6/28  brain]
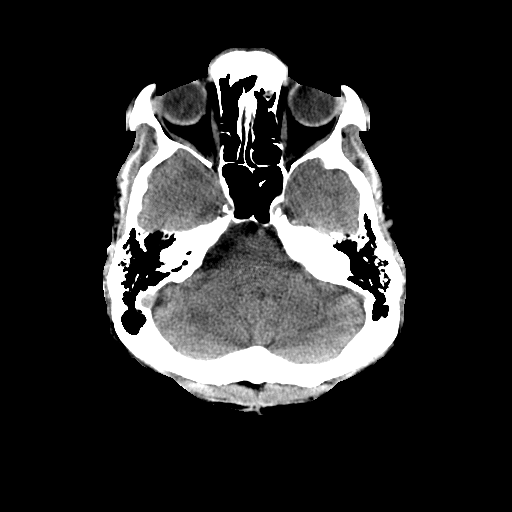
[im 8/28  brain]
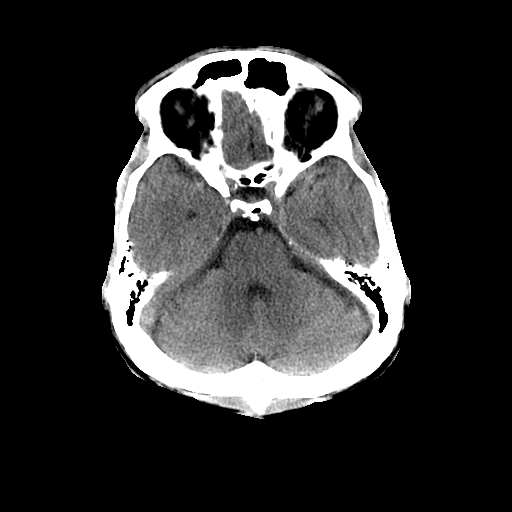
[im 10/28  brain]
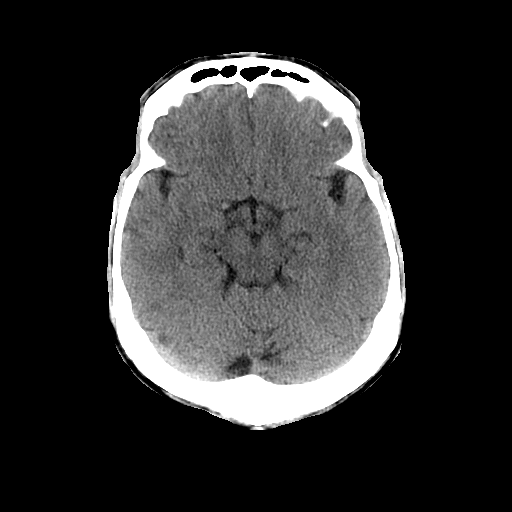
[im 10/28  bone]
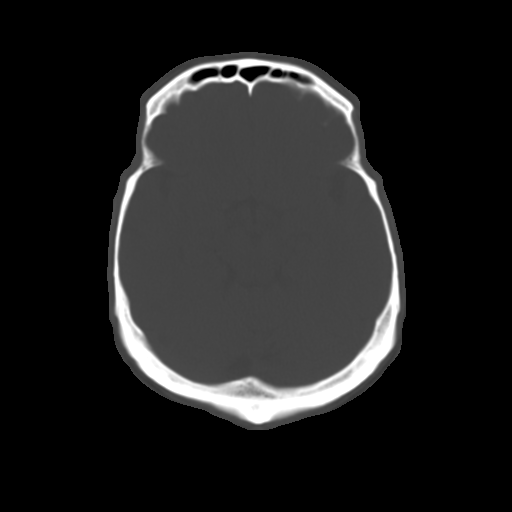
[im 12/28  brain]
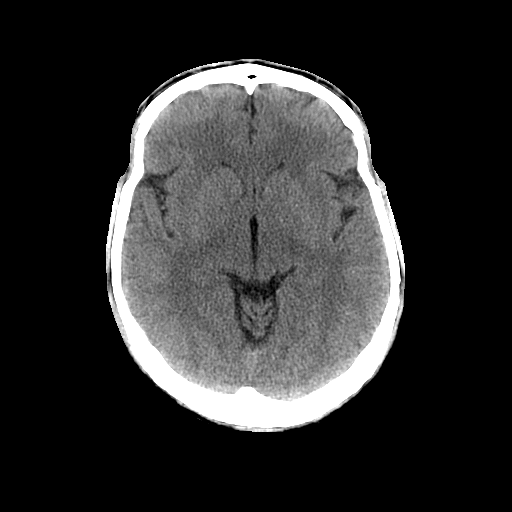
[im 14/28  brain]
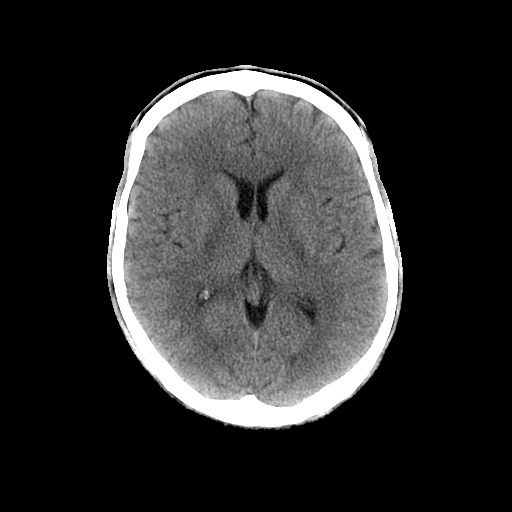
[im 16/28  brain]
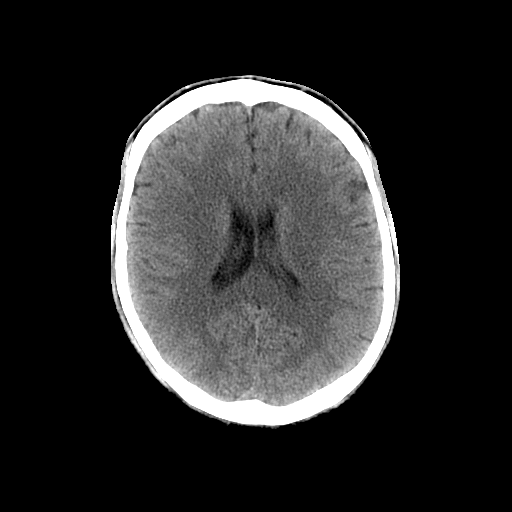
[im 18/28  brain]
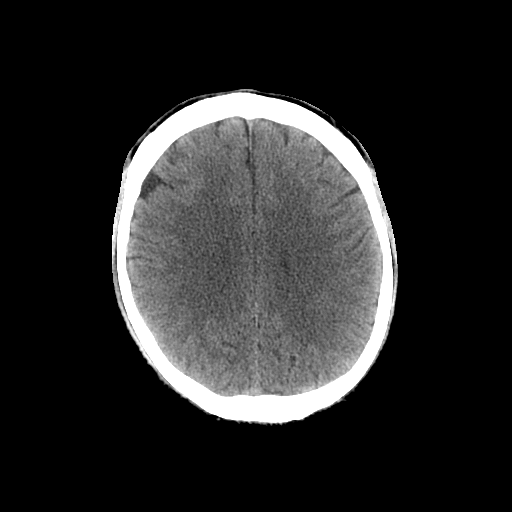
[im 18/28  bone]
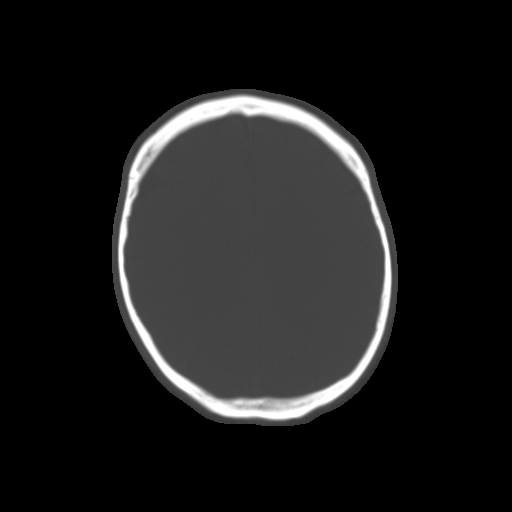
[im 20/28  brain]
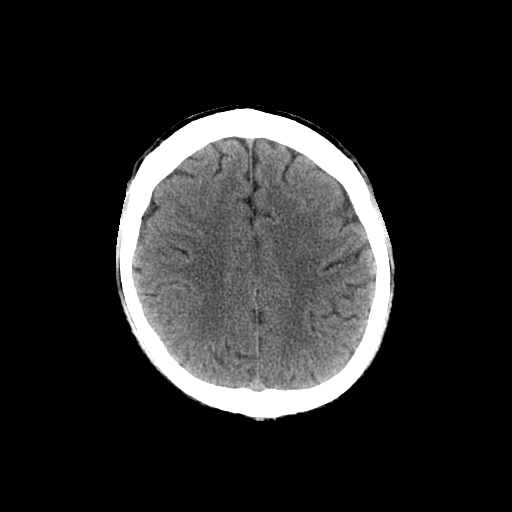
[im 22/28  brain]
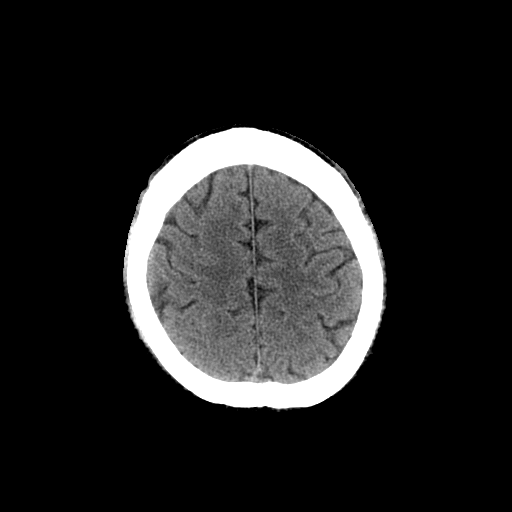
[im 24/28  brain]
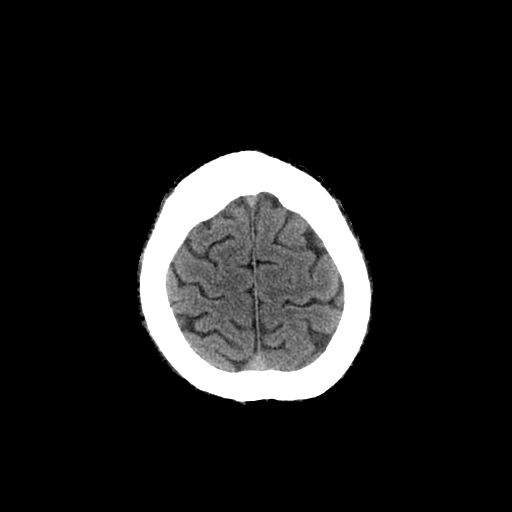
[im 26/28  brain]
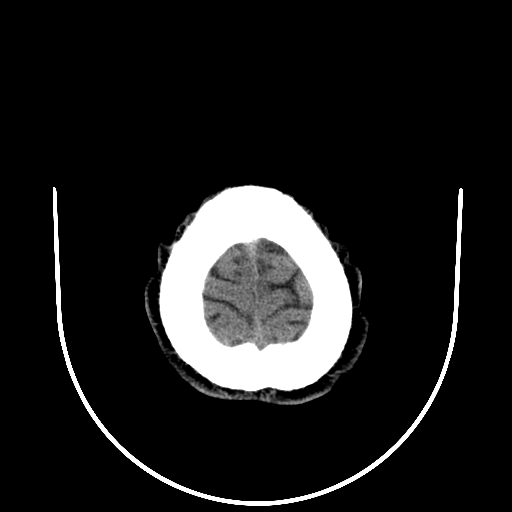
[im 26/28  bone]
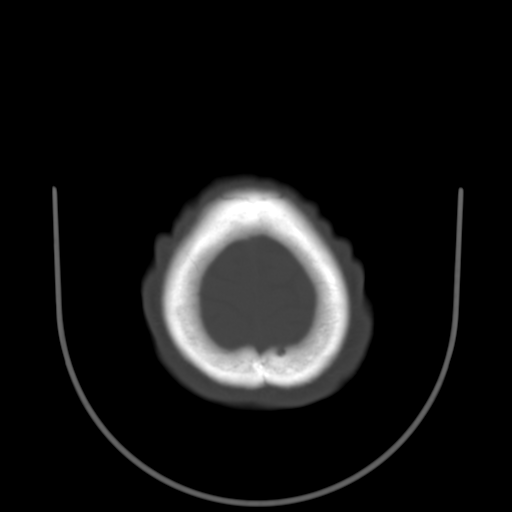

[16 of 30 positions shown; findings below may reference images not displayed]

FINDINGS: No acute hemorrhage, infarct, or mass lesion is identified. No
midline shift. Ventricles are normal in size. Orbits and paranasal
sinuses are unremarkable. No skull fracture.
IMPRESSION: Normal exam.

## 2016-06-06 ENCOUNTER — Ambulatory Visit: Payer: BLUE CROSS/BLUE SHIELD | Admitting: Family Medicine

## 2016-06-07 ENCOUNTER — Ambulatory Visit: Payer: BLUE CROSS/BLUE SHIELD | Admitting: Family Medicine

## 2016-07-05 ENCOUNTER — Encounter: Payer: Self-pay | Admitting: Family Medicine

## 2016-07-05 ENCOUNTER — Ambulatory Visit (INDEPENDENT_AMBULATORY_CARE_PROVIDER_SITE_OTHER): Payer: BLUE CROSS/BLUE SHIELD | Admitting: Family Medicine

## 2016-07-05 VITALS — BP 140/82 | HR 72 | Temp 98.0°F | Resp 18 | Ht 67.5 in | Wt 179.0 lb

## 2016-07-05 DIAGNOSIS — E785 Hyperlipidemia, unspecified: Secondary | ICD-10-CM | POA: Insufficient documentation

## 2016-07-05 DIAGNOSIS — E119 Type 2 diabetes mellitus without complications: Secondary | ICD-10-CM

## 2016-07-05 LAB — POCT GLYCOSYLATED HEMOGLOBIN (HGB A1C): Hemoglobin A1C: 7.5

## 2016-07-05 LAB — GLUCOSE, POCT (MANUAL RESULT ENTRY): POC GLUCOSE: 150 mg/dL — AB (ref 70–99)

## 2016-07-05 MED ORDER — METFORMIN HCL 1000 MG PO TABS: 1000.0000 mg | ORAL_TABLET | Freq: Two times a day (BID) | ORAL | 1 refills | Status: AC

## 2016-07-05 MED ORDER — GLIMEPIRIDE 2 MG PO TABS: 3.0000 mg | ORAL_TABLET | Freq: Every day | ORAL | 1 refills | Status: AC

## 2016-07-05 MED ORDER — GLIMEPIRIDE 2 MG PO TABS: 3.0000 mg | ORAL_TABLET | Freq: Every day | ORAL | 1 refills | Status: DC

## 2016-07-05 NOTE — Progress Notes (Signed)
By signing my name below, I, Mesha Guinyard, attest that this documentation has been prepared under the direction and in the presence of Meredith Staggers, MD.  Electronically Signed: Arvilla Market, Medical Scribe. 07/05/16. 3:39 PM.  Subjective:    Patient ID: Terrence Myers, male    DOB: October 23, 1961, 55 y.o.   MRN: [id number]  HPI Chief Complaint  Patient presents with  . Follow-up    Diabetes    HPI Comments: Terrence Myers is a 55 y.o. male who presents to the [facility] for DM follow-up.  DM: Uncontrolled but improving last visit. We increased his Metformin to 1000 mg BID, and continued Amaryl 2 mg QD. Pt's blood sugar at home has been around 140, and he forgets his medication once or twice a month. Pt denies experiencing any hypoglycemic symptoms. Lab Results  Component Value Date   HGBA1C 8.0 02/29/2016   No results found for: Concepcion Elk He just went to the restroom, but plans to give a sample next visit.  HLD: He was continued on Lipitor 10 mg QD. Diet discussed. Pt mentions he occasionally exercises. Pt denies experiencing negative side effects on his medication such as myalgia, arthralgias, HA, light-headedness, abdominal pain and any other symptoms. Lab Results  Component Value Date   CHOL 201 (H) 02/29/2016   HDL 55 02/29/2016   LDLCALC 129 02/29/2016   TRIG 86 02/29/2016   CHOLHDL 3.7 02/29/2016   Lab Results  Component Value Date   ALT 10 02/29/2016   AST 11 02/29/2016   ALKPHOS 42 02/29/2016   BILITOT 0.6 02/29/2016    Patient Active Problem List   Diagnosis Date Noted  . Type 2 diabetes mellitus (HCC) 12/20/2015   Past Medical History:  Diagnosis Date  . Depression   . Diabetes mellitus without complication [facility])    Past Surgical History:  Procedure Laterality Date  . HERNIA REPAIR     No Known Allergies Prior to Admission medications   Medication Sig Start Date End Date Taking? Authorizing Provider    amoxicillin-clavulanate (AUGMENTIN) 875-125 MG tablet Take 1 tablet by mouth 2 (two) times daily. 02/29/16  Yes Shade Flood, MD  atorvastatin (LIPITOR) 10 MG tablet Take 1 tablet (10 mg total) by mouth daily. 02/29/16  Yes Shade Flood, MD  glimepiride (AMARYL) 2 MG tablet Take 1 tablet (2 mg total) by mouth daily before breakfast. 02/29/16  Yes Shade Flood, MD  glucose blood test strip Use as instructed 08/13/15  Yes Peyton Najjar, MD  metFORMIN (GLUCOPHAGE) 1000 MG tablet Take 1 tablet (1,000 mg total) by mouth 2 (two) times daily with a meal. 02/29/16  Yes Shade Flood, MD   Social History   Social History  . Marital status: Married    Spouse name: N/A  . Number of children: N/A  . Years of education: Elementary   Occupational History  . N/A    Social History Main Topics  . Smoking status: Former Games developer  . Smokeless tobacco: Never Used  . Alcohol use No  . Drug use: No  . Sexual activity: Not on file   Other Topics Concern  . Not on file   Social History Narrative  . No narrative on file   Depression screen [facility] 2/9 07/05/2016 02/29/2016 12/20/2015 08/13/2015 07/15/2015  Decreased Interest 0 0 0 0 1  Down, Depressed, Hopeless 0 0 0 0 -  PHQ - 2 Score 0 0 0 0 1  Altered sleeping - - - - -  Tired, decreased energy - - - - -  Feeling bad or failure about yourself  - - - - -  Trouble concentrating - - - - -  Moving slowly or fidgety/restless - - - - -  Suicidal thoughts - - - - -  PHQ-9 Score - - - - -  Difficult doing work/chores - - - - -   Review of Systems  Constitutional: Negative for fatigue and unexpected weight change.  Eyes: Negative for visual disturbance.  Respiratory: Negative for cough, chest tightness and shortness of breath.   Cardiovascular: Negative for chest pain, palpitations and leg swelling.  Gastrointestinal: Negative for abdominal pain and blood in stool.  Musculoskeletal: Negative for arthralgias and myalgias.  Neurological: Negative for  dizziness, light-headedness and headaches.   Objective:  Physical Exam  Constitutional: He is oriented to person, place, and time. He appears well-developed and well-nourished.  HENT:  Head: Normocephalic and atraumatic.  Eyes: EOM are normal. Pupils are equal, round, and reactive to light.  Neck: No JVD present. Carotid bruit is not present.  Cardiovascular: Normal rate, regular rhythm and normal heart sounds.   No murmur heard. Pulmonary/Chest: Effort normal and breath sounds normal. He has no rales.  Musculoskeletal: He exhibits no edema.  Neurological: He is alert and oriented to person, place, and time.  Skin: Skin is warm and dry.  Psychiatric: He has a normal mood and affect.  Vitals reviewed. BP 140/82 (BP Location: Right Arm, Patient Position: Sitting, Cuff Size: Small)   Pulse 72   Temp 98 F (36.7 C) (Oral)   Resp 18   Ht 5' 7.5" (1.715 m)   Wt 179 lb (81.2 kg)   SpO2 98%   BMI 27.62 kg/m    Results for orders placed or performed in visit on 07/05/16  POCT glycosylated hemoglobin (Hb A1C)  Result Value Ref Range   Hemoglobin A1C 7.5   POCT glucose (manual entry)  Result Value Ref Range   POC Glucose 150 (A) 70 - 99 mg/dl    Assessment & Plan:   Terrence Myers is a 55 y.o. male Type 2 diabetes mellitus without complication, without long-term current use of insulin (HCC) - Plan: POCT glycosylated hemoglobin (Hb A1C), POCT glucose (manual entry), metFORMIN (GLUCOPHAGE) 1000 MG tablet, glimepiride (AMARYL) 2 MG tablet, DISCONTINUED: glimepiride (AMARYL) 2 MG tablet  -Improved, but still uncontrolled. We'll increase his Amaryl to 3 mg total per day, continue metformin the same dose. Recheck in 3 months,  Hyperlipidemia  -Tolerating Lipitor. No changes for now.  Meds ordered this encounter  Medications  . DISCONTD: glimepiride (AMARYL) 2 MG tablet    Sig: Take 1.5 tablets (3 mg total) by mouth daily before breakfast.    Dispense:  90 tablet    Refill:  1  .  metFORMIN (GLUCOPHAGE) 1000 MG tablet    Sig: Take 1 tablet (1,000 mg total) by mouth 2 (two) times daily with a meal.    Dispense:  180 tablet    Refill:  1  . glimepiride (AMARYL) 2 MG tablet    Sig: Take 1.5 tablets (3 mg total) by mouth daily before breakfast.    Dispense:  135 tablet    Refill:  1   Patient Instructions   Your blood sugar has improved, but not quite at goal of under 7.0. Continue metformin at the same dose of 1000 mg twice per day, but increased her Amaryl to 1-1/2 tablets each day. Continue to watch diet, and recheck  in 3 months.   IF you received an x-ray today, you will receive an invoice from [facility]. Please contact [facility] at [phone] with questions or concerns regarding your invoice.   IF you received labwork today, you will receive an invoice from [facility]. Please contact [facility] at [phone] with questions or concerns regarding your invoice.   Our billing staff will not be able to assist you with questions regarding bills from these companies.  You will be contacted with the lab results as soon as they are available. The fastest way to get your results is to activate your My Chart account. Instructions are located on the last page of this paperwork. If you have not heard from [place] regarding the results in 2 weeks, please contact this office.       I personally performed the services described in this documentation, which was scribed in my presence. The recorded information has been reviewed and considered, and addended by me as needed.   Signed,   Meredith Staggers, MD [facility].  07/06/16 10:37 PM

## 2016-07-05 NOTE — Patient Instructions (Addendum)
Your blood sugar has improved, but not quite at goal of under 7.0. Continue metformin at the same dose of 1000 mg twice per day, but increased her Amaryl to 1-1/2 tablets each day. Continue to watch diet, and recheck in 3 months.   IF you received an x-ray today, you will receive an [facility]. Please contact [facility] at [phone] with questions or concerns regarding your invoice.   IF you received labwork today, you will receive an invoice from [facility]. Please contact [facility] at [phone] with questions or concerns regarding your invoice.   Our billing staff will not be able to assist you with questions regarding bills from these companies.  You will be contacted with the lab results as soon as they are available. The fastest way to get your results is to activate your My Chart account. Instructions are located on the last page of this paperwork. If you have not heard from us regarding the results in 2 weeks, please contact this office.
